# Patient Record
Sex: Female | Born: 1995 | Race: White | Hispanic: No | Marital: Single | Smoking: Current every day smoker
Health system: Southern US, Community
[De-identification: ages and names within clinical notes are randomized; demographics above are authoritative.]

## PROBLEM LIST (undated history)

## (undated) DIAGNOSIS — F909 Attention-deficit hyperactivity disorder, unspecified type: Secondary | ICD-10-CM

## (undated) DIAGNOSIS — G4700 Insomnia, unspecified: Secondary | ICD-10-CM

## (undated) DIAGNOSIS — J029 Acute pharyngitis, unspecified: Secondary | ICD-10-CM

## (undated) DIAGNOSIS — M4120 Other idiopathic scoliosis, site unspecified: Secondary | ICD-10-CM

## (undated) DIAGNOSIS — T50995A Adverse effect of other drugs, medicaments and biological substances, initial encounter: Secondary | ICD-10-CM

## (undated) HISTORY — DX: Insomnia, unspecified: G47.00

## (undated) HISTORY — DX: Other idiopathic scoliosis, site unspecified: M41.20

## (undated) HISTORY — DX: Adverse effect of other drugs, medicaments and biological substances, initial encounter: T50.995A

## (undated) HISTORY — DX: Attention-deficit hyperactivity disorder, unspecified type: F90.9

## (undated) HISTORY — DX: Acute pharyngitis, unspecified: J02.9

---
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
## 2015-01-28 ENCOUNTER — Ambulatory Visit (INDEPENDENT_AMBULATORY_CARE_PROVIDER_SITE_OTHER): Payer: 59 | Admitting: Internal Medicine

## 2015-01-28 ENCOUNTER — Encounter: Payer: Self-pay | Admitting: Internal Medicine

## 2015-01-28 VITALS — BP 106/60 | Temp 98.7°F | Ht 64.0 in | Wt 115.0 lb

## 2015-01-28 DIAGNOSIS — F4322 Adjustment disorder with anxiety: Secondary | ICD-10-CM

## 2015-01-28 DIAGNOSIS — Z0000 Encounter for general adult medical examination without abnormal findings: Secondary | ICD-10-CM

## 2015-01-28 DIAGNOSIS — R634 Abnormal weight loss: Secondary | ICD-10-CM

## 2015-01-28 DIAGNOSIS — M4120 Other idiopathic scoliosis, site unspecified: Secondary | ICD-10-CM

## 2015-01-28 DIAGNOSIS — Z79899 Other long term (current) drug therapy: Secondary | ICD-10-CM

## 2015-01-28 DIAGNOSIS — Z23 Encounter for immunization: Secondary | ICD-10-CM

## 2015-01-28 DIAGNOSIS — F909 Attention-deficit hyperactivity disorder, unspecified type: Secondary | ICD-10-CM | POA: Insufficient documentation

## 2015-01-28 MED ORDER — ESCITALOPRAM OXALATE 10 MG PO TABS
15.0000 mg | ORAL_TABLET | Freq: Every day | ORAL | Status: DC
Start: 2015-01-28 — End: 2015-08-08

## 2015-01-28 MED ORDER — METHYLPHENIDATE 30 MG/9HR TD PTCH: 1.0000 | MEDICATED_PATCH | Freq: Every day | TRANSDERMAL | Status: DC

## 2015-01-28 NOTE — Progress Notes (Signed)
Pre visit review using our clinic review tool, if applicable. No additional management support is needed unless otherwise documented below in the visit note.  Subjective:     History was provided by the patient.  Sandra Ali is a 19 y.o. female who is here for this wellness visit. And med check  Interview alone and with mom    Current Issues: Current concerns include:None Sleep  Not that good   Same   Bed at night mid night 2 am then up at 6 45 am   On 15 mg fpr a months  adhd patch 6-7 and then off   When when done  ith hw 9 1 0.   H (Home) Family Relationships: Physically aggressive with her sister.  Working on relationships.  States that she cannot wait to move out. Communication: Negative communication with her family.  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  Takes care of her dog.  Does have chores but often do not get completed.  E (Education): Grades: Mostly C's and D's could better .  To do test and corrections.   School: [employer].  Good attendence Future Plans: unsure  A (Activities) Sports: no sports Exercise: Does light workouts Activities: Likes art Friends: Has a lot of friends.  A (Auton/Safety) Auto: wears seat belt Bike: does not ride Safety: can swim  D (Diet) Diet: poor diet habits Risky eating habits: Has not been hungry so she does not eat. Intake: Working on taking a multiple vitamin as a supplement Body Image: positive body image  Drugs Tobacco: Used cigarettes for 3 months but is trying to stop.  Has been without for 3 days. Alcohol: No Drugs: No  Sex Activity: abstinent  Suicide Risk Emotions: anxiety Depression: feelings of depression Suicidal: denies suicidal ideation vaping 1 puff every 2-3 hours  Going off tob     Objective:     Filed Vitals:   01/28/15 1448  BP: 106/60  Temp: 98.7 F (37.1 C)  TempSrc: Oral  Height: 5\' 4"  (1.626 m)   Weight: 115 lb (52.164 kg)   Growth parameters are noted and are appropriate for age. Wt Readings from Last 3 Encounters:  01/28/15 115 lb (52.164 kg) (30 %*, Z = -0.52)  11/23/14 117 lb (53.071 kg) (35 %*, Z = -0.37)  10/27/14 120 lb 9.6 oz (54.704 kg) (44 %*, Z = -0.16)   * Growth percentiles are based on CDC 2-20 Years data.    Physical Exam Well-developed well-nourished healthy-appearing appears stated age in no acute distress.  HEENT: Normocephalic  TMs clear  Nl lm  EACs  Eyes RR x2 EOMs appear normal nares patent OP clear teeth in adequate repair. Neck: supple without adenopathy Chest :clear to auscultation breath sounds equal no wheezes rales or rhonchi Breast: normal by inspection . No dimpling, discharge, masses, tenderness or discharge . Cardiovascular :PMI nondisplaced S1-S2 no gallops or murmurs peripheral pulses present without delay Abdomen :soft without organomegaly guarding or rebound Lymph nodes :no significant adenopathy neck axillary inguinal External GU :normal Tanner 5 Extremities: no acute deformities normal range of motion no acute swelling Gait within normal limits Spine t scoliosis right  Neurologic: grossly nonfocal normal tone cranial nerves appear intact. Skin: no acute rashes Screening ortho / MS exam:  Right rib hump  scoliosis ,LOM , joint swelling or gait disturbance . Muscle mass is normal .    Assessment:  Visit for  preventive health examination - Plan: Basic metabolic panel, CBC with Differential/Platelet, Hepatic function panel, Lipid panel, TSH, T4, free  Medication management  Attention deficit hyperactivity disorder (ADHD), unspecified ADHD type  Loss of weight - says she is not hungry  a lot disc weight ok but not to lose  plan labs   Adjustment disorder with anxious mood - poss also depression  SCOLIOSIS, MILD  Need for meningococcal vaccination - Plan: Meningococcal conjugate vaccine 4-valent IM      Plan:   1. Anticipatory  guidance discussed. Nutrition and Physical activity Disc  Weight loss  Labs ordered but couldn't get with venipuncture  Labs  Refill daytrana   Stay on lexapro 15  Agree with counseling  Consider personal counseling also  2. Follow-up visit in 12 months for next wellness visit, or sooner as needed.

## 2015-01-28 NOTE — Patient Instructions (Addendum)
Take lexapro every day may help with sleep and school. Proceed with the add counsleing    Depression and anxiety etc . Consider [facility]  Counselors also .    Health Maintenance - 57-19 Years Old SCHOOL PERFORMANCE After high school, you may attend college or technical or vocational school, enroll in the [employer], or enter the workforce. PHYSICAL, SOCIAL, AND EMOTIONAL DEVELOPMENT  One hour of regular physical activity daily is recommended. Continue to participate in sports.  Develop your own interests and consider community service or volunteerism.  Make decisions about college and work plans.  Throughout these years, you should assume responsibility for your own health care. Increasing independence is important for you.  You may be exploring your sexual identity. Understand that you should never be in a situation that makes you feel uncomfortable, and tell your partner if you do not want to engage in sexual activity.  Body image may become important to you. Be mindful that eating disorders can develop at this time. Talk to your parents or other caregivers if you have concerns about body image, weight gain, or losing weight.  You may notice mood disturbances, depression, anxiety, attention problems, or trouble with alcohol. Talk to your health care provider if you have concerns about mental illness.  Set limits for yourself and talk with your parents or other caregivers about independent decision making.  Handle conflict without physical violence.  Avoid loud noises which may impair hearing.  Limit television and computer time to 2 hours each day. Individuals who engage in excessive inactivity are more likely to become overweight. RECOMMENDED IMMUNIZATIONS  Influenza vaccine.  All adults should be immunized every year.  All adults, including pregnant women and people with hives-only allergy to eggs, can receive the inactivated influenza (IIV) vaccine.  Adults aged 18-49  years can receive the recombinant influenza (RIV) vaccine. The RIV vaccine does not contain any egg protein.  Tetanus, diphtheria, and acellular pertussis (Td, Tdap) vaccine.  Pregnant women should receive 1 dose of Tdap vaccine during each pregnancy. The dose should be obtained regardless of the length of time since the last dose. Immunization is preferred during the 27th to 36th week of gestation.  An adult who has not previously received Tdap or who does not know his or her vaccine status should receive 1 dose of Tdap. This initial dose should be followed by tetanus and diphtheria toxoids (Td) booster doses every 10 years.  Adults with an unknown or incomplete history of completing a 3-dose immunization series with Td-containing vaccines should begin or complete a primary immunization series including a Tdap dose.  Adults should receive a Td booster every 10 years.  Varicella vaccine.  An adult without evidence of immunity to varicella should receive 2 doses or a second dose if he or she has previously received 1 dose.  Pregnant females who do not have evidence of immunity should receive the first dose after pregnancy. This first dose should be obtained before leaving the health care facility. The second dose should be obtained 4-8 weeks after the first dose.  Human papillomavirus (HPV) vaccine.  Females aged 13-26 years who have not received the vaccine previously should obtain the 3-dose series.  The vaccine is not recommended for pregnant females. However, pregnancy testing is not needed before receiving a dose. If a female is found to be pregnant after receiving a dose, no treatment is needed. In that case, the remaining doses should be delayed until after the pregnancy.  Males  aged 13-21 years who have not received the vaccine previously should receive the 3-dose series. Males aged 22-26 years may be immunized.  Immunization is recommended through the age of 71 years for any female  who has sex with males and did not get any or all doses earlier.  Immunization is recommended for any person with an immunocompromised condition through the age of 24 years if he or she did not get any or all doses earlier.  During the 3-dose series, the second dose should be obtained 4-8 weeks after the first dose. The third dose should be obtained 24 weeks after the first dose and 16 weeks after the second dose.  Measles, mumps, and rubella (MMR) vaccine.  Adults born in [birth year] or later should have 1 or more doses of MMR vaccine unless there is a contraindication to the vaccine or there is laboratory evidence of immunity to each of the three diseases.  A routine second dose of MMR vaccine should be obtained at least 28 days after the first dose for students attending postsecondary schools, health care workers, and international travelers.  For females of childbearing age, rubella immunity should be determined. If there is no evidence of immunity, females who are not pregnant should be vaccinated. If there is no evidence of immunity, females who are pregnant should delay immunization until after pregnancy.  Pneumococcal 13-valent conjugate (PCV13) vaccine.  When indicated, a person who is uncertain of his or her immunization history and has no record of immunization should receive the PCV13 vaccine.  An adult aged 42 years or older who has certain medical conditions and has not been previously immunized should receive 1 dose of PCV13 vaccine. This PCV13 should be followed with a dose of pneumococcal polysaccharide (PPSV23) vaccine. The PPSV23 vaccine dose should be obtained at least 8 weeks after the dose of PCV13 vaccine.  An adult aged 62 years or older who has certain medical conditions and previously received 1 or more doses of PPSV23 vaccine should receive 1 dose of PCV13. The PCV13 vaccine dose should be obtained 1 or more years after the last PPSV23 vaccine dose.  Pneumococcal  polysaccharide (PPSV23) vaccine.  When PCV13 is also indicated, PCV13 should be obtained first.  An adult younger than age 65 years who has certain medical conditions should be immunized.  Any person who resides in a long-term care facility should be immunized.  An adult smoker should be immunized.  People with an immunocompromised condition and certain other conditions should receive both PCV13 and PPSV23 vaccines.  People with human immunodeficiency virus (HIV) infection should be immunized as soon as possible after diagnosis.  Immunization during chemotherapy or radiation therapy should be avoided.  Routine use of PPSV23 vaccine is not recommended for American Indians, [place] Natives, or people younger than 65 years unless there are medical conditions that require PPSV23 vaccine.  When indicated, people who have unknown immunization and have no record of immunization should receive PPSV23 vaccine.  One-time revaccination 5 years after the first dose of PPSV23 is recommended for people aged 19-64 years who have chronic kidney failure, nephrotic syndrome, asplenia, or immunocompromised conditions.  Meningococcal vaccine.  Adults with asplenia or persistent complement component deficiencies should receive 2 doses of quadrivalent meningococcal conjugate (MenACWY-D) vaccine. The doses should be obtained at least 2 months apart.  Microbiologists working with certain meningococcal bacteria, [employer] recruits, people at risk during an outbreak, and people who travel to or live in countries with a high rate of meningitis  should be immunized.  A first-year college student up through age 31 years who is living in a residence hall should receive a dose if he or she did not receive a dose on or after his or her 16th birthday.  Adults who have certain high-risk conditions should receive one or more doses of vaccine.  Hepatitis A vaccine.  Adults who wish to be protected from this disease, have  certain high-risk conditions, work with hepatitis A-infected animals, work in hepatitis A research labs, or travel to or work in countries with a high rate of hepatitis A should be immunized.  Adults who were previously unvaccinated and who anticipate close contact with an international adoptee during the first 60 days after arrival in the [place] from a country with a high rate of hepatitis A should be immunized.  Hepatitis B vaccine.  Adults who wish to be protected from this disease, have certain high-risk conditions, may be exposed to blood or other infectious body fluids, are household contacts or sex partners of hepatitis B positive people, are clients or workers in certain care facilities, or travel to or work in countries with a high rate of hepatitis B should be immunized.  Haemophilus influenzae type b (Hib) vaccine.  A previously unvaccinated person with asplenia or sickle cell disease or having a scheduled splenectomy should receive 1 dose of Hib vaccine.  Regardless of previous immunization, a recipient of a hematopoietic stem cell transplant should receive a 3-dose series 6-12 months after his or her successful transplant.  Hib vaccine is not recommended for adults with HIV infection. TESTING  Annual screening for vision and hearing problems is recommended. Vision should be screened at least once between 22-31 years of age.  You may be screened for anemia or tuberculosis.  You should have a blood test to check for high cholesterol.  You should be screened for alcohol and drug use.  If you are sexually active, you may be screened for sexually transmitted infections (STIs), pregnancy, or HIV. You should be screened for STIs if:  Your sexual activity has changed since the last screening test, and you are at an increased risk for chlamydia or gonorrhea. Ask your health care provider if you are at risk.  If you are at an increased risk for hepatitis B, you should be  screened for this virus. You are considered at high risk for hepatitis B if you:  Were born in a country where hepatitis B occurs often. Talk with your health care provider about which countries are considered high risk.  Have parents who were born in a high-risk country and have not received a shot to protect against hepatitis B (hepatitis B vaccine).  Have HIV or AIDS.  Use needles to inject street drugs.  Live with or have sex with someone who has hepatitis B.  Are a man who has sex with other men (MSM).  Get hemodialysis treatment.  Take certain medicines for conditions like cancer, organ transplantation, or autoimmune conditions. NUTRITION   You should:  Have three servings of low-fat milk and dairy products daily. If you do not drink milk or consume dairy products, you should eat calcium-enriched foods, such as juice, bread, or cereal. Dark, leafy greens or canned fish are alternate sources of calcium.  Drink plenty of water. Fruit juice should be limited to 8-12 oz (240-360 mL) each day. Sugary beverages and sodas should be avoided.  Avoid eating foods high in fat, salt, or sugar, such as chips, candy,  and cookies.  Avoid fast foods and limit eating out at restaurants.  Try not to skip meals, especially breakfast. You should eat a variety of vegetables, fruits, and lean meats.  Eat meals together as a family whenever possible. ORAL HEALTH Brush your teeth twice a day and floss at least once a day. You should have two dental exams a year.  SKIN CARE You should wear sunscreen when out in the sun. TALK TO SOMEONE ABOUT:  Precautions against pregnancy, contraception, and sexually transmitted infections.  Taking a prescription medicine daily to prevent HIV infection if you are at risk of being infected with HIV. This is called preexposure prophylaxis (PrEP). You are at risk if you:  Are a female who has sex with other males (MSM).  Are heterosexual and sexually active with  more than one partner.  Take drugs by injection.  Are sexually active with a partner who has HIV.  Whether you are at high risk of being infected with HIV. If you choose to begin PrEP, you should first be tested for HIV. You should then be tested every 3 months for as long as you are taking PrEP.  Drug, tobacco, and alcohol use among your friends or at friends' homes. Smoking tobacco or marijuana and taking drugs have health consequences and may impact your brain development.  Appropriate use of over-the-counter or prescription medicines.  Driving guidelines and riding with friends.  The risks of drinking and driving or boating. Call someone if you have been drinking or using drugs and need a ride. WHAT'S NEXT? Visit your pediatrician or family physician once a year. By young adulthood, you should transition from your pediatrician to a family physician or internal medicine specialist. If you are a female and are sexually active, you may want to begin annual physical exams with a gynecologist. Document Released: 02/21/2007 Document Revised: 12/01/2013 Document Reviewed: 03/13/2007 [facility] Patient Information 2015 [place], [place]. This information is not intended to replace advice given to you by your health care provider. Make sure you discuss any questions you have with your health care provider.

## 2015-02-14 ENCOUNTER — Telehealth: Payer: Self-pay | Admitting: Family Medicine

## 2015-02-14 ENCOUNTER — Other Ambulatory Visit (INDEPENDENT_AMBULATORY_CARE_PROVIDER_SITE_OTHER): Payer: 59

## 2015-02-14 DIAGNOSIS — Z0000 Encounter for general adult medical examination without abnormal findings: Secondary | ICD-10-CM

## 2015-02-14 DIAGNOSIS — R634 Abnormal weight loss: Secondary | ICD-10-CM

## 2015-02-14 LAB — HEPATIC FUNCTION PANEL
ALBUMIN: 4.6 g/dL (ref 3.5–5.2)
ALT: 9 U/L (ref 0–35)
AST: 13 U/L (ref 0–37)
Alkaline Phosphatase: 79 U/L (ref 47–119)
BILIRUBIN TOTAL: 0.5 mg/dL (ref 0.3–1.2)
Bilirubin, Direct: 0.1 mg/dL (ref 0.0–0.3)
Total Protein: 6.9 g/dL (ref 6.0–8.3)

## 2015-02-14 LAB — CBC WITH DIFFERENTIAL/PLATELET
Basophils Absolute: 0 10*3/uL (ref 0.0–0.1)
Basophils Relative: 0.5 % (ref 0.0–3.0)
EOS PCT: 4.3 % (ref 0.0–5.0)
Eosinophils Absolute: 0.3 10*3/uL (ref 0.0–0.7)
HCT: 39.6 % (ref 36.0–49.0)
Hemoglobin: 13.5 g/dL (ref 12.0–16.0)
LYMPHS PCT: 39.4 % (ref 24.0–48.0)
Lymphs Abs: 2.3 10*3/uL (ref 0.7–4.0)
MCHC: 34 g/dL (ref 31.0–37.0)
MCV: 86.5 fl (ref 78.0–98.0)
MONOS PCT: 8.7 % (ref 3.0–12.0)
Monocytes Absolute: 0.5 10*3/uL (ref 0.1–1.0)
NEUTROS PCT: 47.1 % (ref 43.0–71.0)
Neutro Abs: 2.8 10*3/uL (ref 1.4–7.7)
Platelets: 389 10*3/uL (ref 150.0–575.0)
RBC: 4.57 Mil/uL (ref 3.80–5.70)
RDW: 12.9 % (ref 11.4–15.5)
WBC: 5.9 10*3/uL (ref 4.5–13.5)

## 2015-02-14 LAB — LIPID PANEL
CHOLESTEROL: 110 mg/dL (ref 0–200)
HDL: 46.5 mg/dL (ref >39.00)
LDL CALC: 43 mg/dL (ref 0–99)
NONHDL: 63.5
Total CHOL/HDL Ratio: 2
Triglycerides: 104 mg/dL (ref 0.0–149.0)
VLDL: 20.8 mg/dL (ref 0.0–40.0)

## 2015-02-14 LAB — POCT URINALYSIS DIP (MANUAL ENTRY)
Bilirubin, UA: NEGATIVE
GLUCOSE UA: NEGATIVE
Ketones, POC UA: NEGATIVE
Leukocytes, UA: NEGATIVE
NITRITE UA: NEGATIVE
Protein Ur, POC: NEGATIVE
RBC UA: NEGATIVE
Spec Grav, UA: 1.01
UROBILINOGEN UA: 0.2
pH, UA: 7

## 2015-02-14 LAB — BASIC METABOLIC PANEL
BUN: 15 mg/dL (ref 6–23)
CHLORIDE: 104 meq/L (ref 96–112)
CO2: 32 meq/L (ref 19–32)
Calcium: 9 mg/dL (ref 8.4–10.5)
Creatinine, Ser: 0.75 mg/dL (ref 0.40–1.20)
GFR: 106.72 mL/min (ref >60.00)
Glucose, Bld: 96 mg/dL (ref 70–99)
Potassium: 3.9 mEq/L (ref 3.5–5.1)
Sodium: 140 mEq/L (ref 135–145)

## 2015-02-14 LAB — TSH: TSH: 0.91 u[IU]/mL (ref 0.40–5.00)

## 2015-02-14 LAB — T4, FREE: Free T4: 1.04 ng/dL (ref 0.60–1.60)

## 2015-02-14 LAB — C-REACTIVE PROTEIN: CRP: <0.1 mg/dL — ABNORMAL LOW (ref 0.5–20.0)

## 2015-02-14 NOTE — Telephone Encounter (Signed)
Lab orders placed.  

## 2015-02-14 NOTE — Telephone Encounter (Signed)
Need future lab orders and diagnosis code.

## 2015-02-15 LAB — CELIAC PANEL 10
Endomysial Screen: NEGATIVE
GLIADIN IGG: 2 U (ref <20)
Gliadin IgA: 3 Units (ref <20)
IgA: 87 mg/dL (ref 69–380)
Tissue Transglut Ab: 1 U/mL (ref <6)
Tissue Transglutaminase Ab, IgA: 1 U/mL (ref <4)

## 2015-02-15 LAB — HIV ANTIBODY (ROUTINE TESTING W REFLEX): HIV: NONREACTIVE

## 2015-02-22 ENCOUNTER — Encounter: Payer: Self-pay | Admitting: Family Medicine

## 2015-03-02 ENCOUNTER — Other Ambulatory Visit: Payer: Self-pay | Admitting: Family Medicine

## 2015-03-02 MED ORDER — METHYLPHENIDATE 30 MG/9HR TD PTCH: 1.0000 | MEDICATED_PATCH | Freq: Every day | TRANSDERMAL | Status: DC

## 2015-07-19 ENCOUNTER — Telehealth: Payer: Self-pay | Admitting: Internal Medicine

## 2015-07-19 NOTE — Telephone Encounter (Signed)
Yes, sorry!   methylphenidate (DAYTRANA) 30 MG/9HR

## 2015-07-19 NOTE — Telephone Encounter (Signed)
Which medication is needed?  Daytrana?

## 2015-07-19 NOTE — Telephone Encounter (Signed)
Mom made appt , first available for pt to fu on her meds.   Pt is back in school and needed a 4 pm appt. First appt was 8/23 .  Mom would like to know if you could give pt rx to get through to appt?

## 2015-07-20 NOTE — Telephone Encounter (Signed)
Ok to prescribe 30 days?

## 2015-07-21 NOTE — Telephone Encounter (Signed)
Ok for 1 month. Looks like when saw Dr. Fabian Sharp was supposed to continue this and follow up in 1 year.

## 2015-07-22 MED ORDER — METHYLPHENIDATE 30 MG/9HR TD PTCH: 1.0000 | MEDICATED_PATCH | Freq: Every day | TRANSDERMAL | Status: DC

## 2015-07-22 NOTE — Telephone Encounter (Signed)
Pt's mother notified to pick up at the front desk. 

## 2015-08-02 ENCOUNTER — Encounter: Payer: 59 | Admitting: Internal Medicine

## 2015-08-02 NOTE — Progress Notes (Signed)
Document opened and reviewed for OV but appt  canceled same day .  

## 2015-08-08 ENCOUNTER — Encounter: Payer: Self-pay | Admitting: Internal Medicine

## 2015-08-08 ENCOUNTER — Ambulatory Visit (INDEPENDENT_AMBULATORY_CARE_PROVIDER_SITE_OTHER): Payer: 59 | Admitting: Internal Medicine

## 2015-08-08 VITALS — BP 106/60 | Temp 98.7°F | Wt 126.9 lb

## 2015-08-08 DIAGNOSIS — Z23 Encounter for immunization: Secondary | ICD-10-CM | POA: Diagnosis not present

## 2015-08-08 DIAGNOSIS — Z79899 Other long term (current) drug therapy: Secondary | ICD-10-CM | POA: Diagnosis not present

## 2015-08-08 DIAGNOSIS — Z113 Encounter for screening for infections with a predominantly sexual mode of transmission: Secondary | ICD-10-CM

## 2015-08-08 DIAGNOSIS — F17200 Nicotine dependence, unspecified, uncomplicated: Secondary | ICD-10-CM

## 2015-08-08 DIAGNOSIS — Z30013 Encounter for initial prescription of injectable contraceptive: Secondary | ICD-10-CM | POA: Diagnosis not present

## 2015-08-08 DIAGNOSIS — F909 Attention-deficit hyperactivity disorder, unspecified type: Secondary | ICD-10-CM

## 2015-08-08 DIAGNOSIS — Z309 Encounter for contraceptive management, unspecified: Secondary | ICD-10-CM

## 2015-08-08 LAB — POCT URINE PREGNANCY: PREG TEST UR: NEGATIVE

## 2015-08-08 MED ORDER — MEDROXYPROGESTERONE ACETATE 150 MG/ML IM SUSP
150.0000 mg | Freq: Once | INTRAMUSCULAR | Status: AC
  Administered 2015-08-08: 150 mg via INTRAMUSCULAR

## 2015-08-08 MED ORDER — METHYLPHENIDATE 30 MG/9HR TD PTCH: 1.0000 | MEDICATED_PATCH | Freq: Every day | TRANSDERMAL | Status: DC

## 2015-08-08 MED ORDER — ESCITALOPRAM OXALATE 10 MG PO TABS: 15.0000 mg | ORAL_TABLET | Freq: Every day | ORAL | Status: DC

## 2015-08-08 MED ORDER — METHYLPHENIDATE HCL ER (XR) 30 MG PO CP24: 1.0000 | ORAL_CAPSULE | Freq: Every day | ORAL | Status: DC

## 2015-08-08 NOTE — Patient Instructions (Addendum)
Will notify you  of labs when available. Begin every 12 weeks depoprovera .  Can see gyne for  iud    Progesterone implants  Try to take lexapro every eveneing for the reasons stated   To see if helps or not . Off and on can give y ou sdie effects .  agree with continuing counseling to help .  Contraception Choices Contraception (birth control) is the use of any methods or devices to prevent pregnancy. Below are some methods to help avoid pregnancy. HORMONAL METHODS   Contraceptive implant. This is a thin, plastic tube containing progesterone hormone. It does not contain estrogen hormone. Your health care provider inserts the tube in the inner part of the upper arm. The tube can remain in place for up to 3 years. After 3 years, the implant must be removed. The implant prevents the ovaries from releasing an egg (ovulation), thickens the cervical mucus to prevent sperm from entering the uterus, and thins the lining of the inside of the uterus.  Progesterone-only injections. These injections are given every 3 months by your health care provider to prevent pregnancy. This synthetic progesterone hormone stops the ovaries from releasing eggs. It also thickens cervical mucus and changes the uterine lining. This makes it harder for sperm to survive in the uterus.  Birth control pills. These pills contain estrogen and progesterone hormone. They work by preventing the ovaries from releasing eggs (ovulation). They also cause the cervical mucus to thicken, preventing the sperm from entering the uterus. Birth control pills are prescribed by a health care provider.Birth control pills can also be used to treat heavy periods.  Minipill. This type of birth control pill contains only the progesterone hormone. They are taken every day of each month and must be prescribed by your health care provider.  Birth control patch. The patch contains hormones similar to those in birth control pills. It must be changed once  a week and is prescribed by a health care provider.  Vaginal ring. The ring contains hormones similar to those in birth control pills. It is left in the vagina for 3 weeks, removed for 1 week, and then a new one is put back in place. The patient must be comfortable inserting and removing the ring from the vagina.A health care provider's prescription is necessary.  Emergency contraception. Emergency contraceptives prevent pregnancy after unprotected sexual intercourse. This pill can be taken right after sex or up to 5 days after unprotected sex. It is most effective the sooner you take the pills after having sexual intercourse. Most emergency contraceptive pills are available without a prescription. Check with your pharmacist. Do not use emergency contraception as your only form of birth control. BARRIER METHODS   Female condom. This is a thin sheath (latex or rubber) that is worn over the penis during sexual intercourse. It can be used with spermicide to increase effectiveness.  Female condom. This is a soft, loose-fitting sheath that is put into the vagina before sexual intercourse.  Diaphragm. This is a soft, latex, dome-shaped barrier that must be fitted by a health care provider. It is inserted into the vagina, along with a spermicidal jelly. It is inserted before intercourse. The diaphragm should be left in the vagina for 6 to 8 hours after intercourse.  Cervical cap. This is a round, soft, latex or plastic cup that fits over the cervix and must be fitted by a health care provider. The cap can be left in place for up to 48 hours  after intercourse.  Sponge. This is a soft, circular piece of polyurethane foam. The sponge has spermicide in it. It is inserted into the vagina after wetting it and before sexual intercourse.  Spermicides. These are chemicals that kill or block sperm from entering the cervix and uterus. They come in the form of creams, jellies, suppositories, foam, or tablets. They do  not require a prescription. They are inserted into the vagina with an applicator before having sexual intercourse. The process must be repeated every time you have sexual intercourse. INTRAUTERINE CONTRACEPTION  Intrauterine device (IUD). This is a T-shaped device that is put in a woman's uterus during a menstrual period to prevent pregnancy. There are 2 types:  Copper IUD. This type of IUD is wrapped in copper wire and is placed inside the uterus. Copper makes the uterus and fallopian tubes produce a fluid that kills sperm. It can stay in place for 10 years.  Hormone IUD. This type of IUD contains the hormone progestin (synthetic progesterone). The hormone thickens the cervical mucus and prevents sperm from entering the uterus, and it also thins the uterine lining to prevent implantation of a fertilized egg. The hormone can weaken or kill the sperm that get into the uterus. It can stay in place for 3-5 years, depending on which type of IUD is used. PERMANENT METHODS OF CONTRACEPTION  Female tubal ligation. This is when the woman's fallopian tubes are surgically sealed, tied, or blocked to prevent the egg from traveling to the uterus.  Hysteroscopic sterilization. This involves placing a small coil or insert into each fallopian tube. Your doctor uses a technique called hysteroscopy to do the procedure. The device causes scar tissue to form. This results in permanent blockage of the fallopian tubes, so the sperm cannot fertilize the egg. It takes about 3 months after the procedure for the tubes to become blocked. You must use another form of birth control for these 3 months.  Female sterilization. This is when the female has the tubes that carry sperm tied off (vasectomy).This blocks sperm from entering the vagina during sexual intercourse. After the procedure, the man can still ejaculate fluid (semen). NATURAL PLANNING METHODS  Natural family planning. This is not having sexual intercourse or using a  barrier method (condom, diaphragm, cervical cap) on days the woman could become pregnant.  Calendar method. This is keeping track of the length of each menstrual cycle and identifying when you are fertile.  Ovulation method. This is avoiding sexual intercourse during ovulation.  Symptothermal method. This is avoiding sexual intercourse during ovulation, using a thermometer and ovulation symptoms.  Post-ovulation method. This is timing sexual intercourse after you have ovulated. Regardless of which type or method of contraception you choose, it is important that you use condoms to protect against the transmission of sexually transmitted infections (STIs). Talk with your health care provider about which form of contraception is most appropriate for you. Document Released: 11/26/2005 Document Revised: 12/01/2013 Document Reviewed: 05/21/2013 [facility] Patient Information 2015 [place], [place]. This information is not intended to replace advice given to you by your health care provider. Make sure you discuss any questions you have with your health care provider.

## 2015-08-08 NOTE — Progress Notes (Signed)
Pre visit review using our clinic review tool, if applicable. No additional management support is needed unless otherwise documented below in the visit note.  Chief Complaint  Patient presents with  . Follow-up    HPI: Sandra Ali 19 y.o. comes in for medical management a number of issues  Add /adhd   daytrana helps some   But sometimes   There is a shortage  Says helps her focus and get som etaks done. If passes course will grad this year .  just got job at [employer]   To be trained  Sleep  No change  Stress   Depress no suicidal  Emotional  And disc being mean when mom is mean back  No physical violence   Has one partner female  Interested in  Birth control method   Using condoms  No etoh drugs  Tobacco 1ppd  tring to cut down  Has nicotine gum.  Mom says doesn't always take lexapro  She says forgets  Unless reminded  s .  ROS: See pertinent positives and negatives per HPI. No cp sob syncope   Past Medical History  Diagnosis Date  . ADHD 09/29/2007  . SORE THROAT 11/24/2007  . SCOLIOSIS, MILD 03/28/2009  . INSOMNIA UNSPECIFIED 02/24/2008  . UNS ADVRS EFF OTH RX MEDICINAL\T\BIOLOGICAL SBSTNC 04/30/2008    Family History  Problem Relation Age of Onset  . Adopted: Yes    Social History   Social History  . Marital Status: Single    Spouse Name: N/A  . Number of Children: N/A  . Years of Education: N/A   Social History Main Topics  . Smoking status: Never Smoker   . Smokeless tobacco: Never Used  . Alcohol Use: No  . Drug Use: No  . Sexual Activity: Not Asked   Other Topics Concern  . None   Social History Narrative   HH of 3   No tobacco   Adopted from  [place]   [employer] in 8th grade   Now [employer] program   Just got a job at  [employer].    Outpatient Prescriptions Prior to Visit  Medication Sig Dispense Refill  . escitalopram (LEXAPRO) 10 MG tablet Take 1.5 tablets (15 mg total) by mouth daily. 135 tablet 1  . methylphenidate  (DAYTRANA) 30 MG/9HR Place 1 patch onto the skin daily. wear patch for 9 hours only each day 30 patch 0  . methylphenidate (DAYTRANA) 30 MG/9HR Place 1 patch onto the skin daily. wear patch for 9 hours only each day 30 patch 0  . methylphenidate (DAYTRANA) 30 MG/9HR Place 1 patch onto the skin daily. wear patch for 9 hours only each day 30 patch 0   No facility-administered medications prior to visit.     EXAM:  BP 106/60 mmHg  Temp(Src) 98.7 F (37.1 C) (Oral)  Wt 126 lb 14.4 oz (57.561 kg)  Body mass index is 21.77 kg/(m^2).  GENERAL: vitals reviewed and listed above, alert, oriented, appears well hydrated and in no acute distress well groomed HEENT: atraumatic, conjunctiva  clear, no obvious abnormalities on inspection of external nose and ears NECK: no obvious masses on inspection palpation  LUNGS: clear to auscultation bilaterally, no wheezes, rales or rhonchi, good air movement CV: HRRR, no clubbing cyanosis or  peripheral edema nl cap refill  MS: moves all extremities without noticeable focal  abnormality PSYCH: pleasant and cooperative,  No tremor disc with and without mom   ASSESSMENT AND PLAN:  Discussed the following assessment and plan:  Attention deficit hyperactivity disorder (ADHD), unspecified ADHD type - medcation no change  back up capsules in case of shortages   Medication management - stay on lexapro for now   Encounter for contraceptive management, unspecified encounter  Encounter for initial prescription of injectable contraceptive - Plan: medroxyPROGESTERone (DEPO-PROVERA) injection 150 mg  Tobacco dependence - dsic stopping  1ppd currently   Routine screening for STI (sexually transmitted infection) - Plan: GC/chlamydia probe amp, urine, POCT urine pregnancy  Encounter for immunization Unclear if lexapro helping and how often missing doses .  Time constraints of  Multiple issues on one visit  Disc  taking every day and reminders to see if helps at all.  And to avoid se .     Pt interest in contraception... Discussed options  adherence an issue    sti risk had  hiv last time .  Plan fu 3 months   mother concerned about risky relationships and behavior  . Getting counseling [facility] mostly about adhd .disc poss focusing on independence  Emotional  And  Close relationships  .  Fu in 3 months     To address  All of above  .  -Patient advised to return or notify health care team  if symptoms worsen ,persist or new concerns arise.  Patient Instructions   Will notify you  of labs when available. Begin every 12 weeks depoprovera .  Can see gyne for  iud    Progesterone implants  Try to take lexapro every eveneing for the reasons stated   To see if helps or not . Off and on can give y ou sdie effects .  agree with continuing counseling to help .  Contraception Choices Contraception (birth control) is the use of any methods or devices to prevent pregnancy. Below are some methods to help avoid pregnancy. HORMONAL METHODS   Contraceptive implant. This is a thin, plastic tube containing progesterone hormone. It does not contain estrogen hormone. Your health care provider inserts the tube in the inner part of the upper arm. The tube can remain in place for up to 3 years. After 3 years, the implant must be removed. The implant prevents the ovaries from releasing an egg (ovulation), thickens the cervical mucus to prevent sperm from entering the uterus, and thins the lining of the inside of the uterus.  Progesterone-only injections. These injections are given every 3 months by your health care provider to prevent pregnancy. This synthetic progesterone hormone stops the ovaries from releasing eggs. It also thickens cervical mucus and changes the uterine lining. This makes it harder for sperm to survive in the uterus.  Birth control pills. These pills contain estrogen and progesterone hormone. They work by preventing the ovaries from releasing eggs (ovulation). They  also cause the cervical mucus to thicken, preventing the sperm from entering the uterus. Birth control pills are prescribed by a health care provider.Birth control pills can also be used to treat heavy periods.  Minipill. This type of birth control pill contains only the progesterone hormone. They are taken every day of each month and must be prescribed by your health care provider.  Birth control patch. The patch contains hormones similar to those in birth control pills. It must be changed once a week and is prescribed by a health care provider.  Vaginal ring. The ring contains hormones similar to those in birth control pills. It is left in the vagina for 3 weeks, removed for 1  week, and then a new one is put back in place. The patient must be comfortable inserting and removing the ring from the vagina.A health care provider's prescription is necessary.  Emergency contraception. Emergency contraceptives prevent pregnancy after unprotected sexual intercourse. This pill can be taken right after sex or up to 5 days after unprotected sex. It is most effective the sooner you take the pills after having sexual intercourse. Most emergency contraceptive pills are available without a prescription. Check with your pharmacist. Do not use emergency contraception as your only form of birth control. BARRIER METHODS   Female condom. This is a thin sheath (latex or rubber) that is worn over the penis during sexual intercourse. It can be used with spermicide to increase effectiveness.  Female condom. This is a soft, loose-fitting sheath that is put into the vagina before sexual intercourse.  Diaphragm. This is a soft, latex, dome-shaped barrier that must be fitted by a health care provider. It is inserted into the vagina, along with a spermicidal jelly. It is inserted before intercourse. The diaphragm should be left in the vagina for 6 to 8 hours after intercourse.  Cervical cap. This is a round, soft, latex or  plastic cup that fits over the cervix and must be fitted by a health care provider. The cap can be left in place for up to 48 hours after intercourse.  Sponge. This is a soft, circular piece of polyurethane foam. The sponge has spermicide in it. It is inserted into the vagina after wetting it and before sexual intercourse.  Spermicides. These are chemicals that kill or block sperm from entering the cervix and uterus. They come in the form of creams, jellies, suppositories, foam, or tablets. They do not require a prescription. They are inserted into the vagina with an applicator before having sexual intercourse. The process must be repeated every time you have sexual intercourse. INTRAUTERINE CONTRACEPTION  Intrauterine device (IUD). This is a T-shaped device that is put in a woman's uterus during a menstrual period to prevent pregnancy. There are 2 types:  Copper IUD. This type of IUD is wrapped in copper wire and is placed inside the uterus. Copper makes the uterus and fallopian tubes produce a fluid that kills sperm. It can stay in place for 10 years.  Hormone IUD. This type of IUD contains the hormone progestin (synthetic progesterone). The hormone thickens the cervical mucus and prevents sperm from entering the uterus, and it also thins the uterine lining to prevent implantation of a fertilized egg. The hormone can weaken or kill the sperm that get into the uterus. It can stay in place for 3-5 years, depending on which type of IUD is used. PERMANENT METHODS OF CONTRACEPTION  Female tubal ligation. This is when the woman's fallopian tubes are surgically sealed, tied, or blocked to prevent the egg from traveling to the uterus.  Hysteroscopic sterilization. This involves placing a small coil or insert into each fallopian tube. Your doctor uses a technique called hysteroscopy to do the procedure. The device causes scar tissue to form. This results in permanent blockage of the fallopian tubes, so the  sperm cannot fertilize the egg. It takes about 3 months after the procedure for the tubes to become blocked. You must use another form of birth control for these 3 months.  Female sterilization. This is when the female has the tubes that carry sperm tied off (vasectomy).This blocks sperm from entering the vagina during sexual intercourse. After the procedure, the man can still  ejaculate fluid (semen). NATURAL PLANNING METHODS  Natural family planning. This is not having sexual intercourse or using a barrier method (condom, diaphragm, cervical cap) on days the woman could become pregnant.  Calendar method. This is keeping track of the length of each menstrual cycle and identifying when you are fertile.  Ovulation method. This is avoiding sexual intercourse during ovulation.  Symptothermal method. This is avoiding sexual intercourse during ovulation, using a thermometer and ovulation symptoms.  Post-ovulation method. This is timing sexual intercourse after you have ovulated. Regardless of which type or method of contraception you choose, it is important that you use condoms to protect against the transmission of sexually transmitted infections (STIs). Talk with your health care provider about which form of contraception is most appropriate for you. Document Released: 11/26/2005 Document Revised: 12/01/2013 Document Reviewed: 05/21/2013 [facility] Patient Information 2015 [place], [place]. This information is not intended to replace advice given to you by your health care provider. Make sure you discuss any questions you have with your health care provider.      Neta Mends. Minnetta Sandora M.D.

## 2015-08-09 LAB — GC/CHLAMYDIA PROBE AMP, URINE
Chlamydia, Swab/Urine, PCR: NEGATIVE
GC Probe Amp, Urine: NEGATIVE

## 2015-09-02 ENCOUNTER — Encounter: Payer: Self-pay | Admitting: Family Medicine

## 2015-10-26 ENCOUNTER — Ambulatory Visit: Payer: 59 | Admitting: Family Medicine

## 2015-10-27 ENCOUNTER — Ambulatory Visit (INDEPENDENT_AMBULATORY_CARE_PROVIDER_SITE_OTHER): Payer: 59 | Admitting: Family Medicine

## 2015-10-27 DIAGNOSIS — Z30013 Encounter for initial prescription of injectable contraceptive: Secondary | ICD-10-CM

## 2015-10-27 MED ORDER — MEDROXYPROGESTERONE ACETATE 150 MG/ML IM SUSP
150.0000 mg | Freq: Once | INTRAMUSCULAR | Status: AC
  Administered 2015-10-27: 150 mg via INTRAMUSCULAR

## 2015-11-05 ENCOUNTER — Other Ambulatory Visit: Payer: Self-pay | Admitting: Internal Medicine

## 2015-11-08 NOTE — Telephone Encounter (Signed)
Pt did not return for 3 month follow up

## 2015-11-14 NOTE — Telephone Encounter (Signed)
Left a message notifying the pt that she is now due for follow up.  Asked that she call the office to schedule.

## 2015-11-14 NOTE — Telephone Encounter (Signed)
Contact patient have her make ROV then refill x 90 days of med

## 2015-11-16 NOTE — Telephone Encounter (Signed)
Left a message for a return call on home and cell. 

## 2015-11-18 NOTE — Telephone Encounter (Signed)
Left a message for a return call.  Tried reaching the pt by telephone multiple times.  [facility].

## 2015-11-18 NOTE — Telephone Encounter (Signed)
Refill 90 days and send a letter

## 2015-11-21 ENCOUNTER — Encounter: Payer: Self-pay | Admitting: Family Medicine

## 2015-11-21 NOTE — Telephone Encounter (Signed)
Sent to the pharmacy for 90 days.  Letter also sent by mail.

## 2015-11-24 ENCOUNTER — Other Ambulatory Visit: Payer: Self-pay | Admitting: Internal Medicine

## 2015-11-25 ENCOUNTER — Telehealth: Payer: Self-pay | Admitting: Internal Medicine

## 2015-11-25 MED ORDER — ESCITALOPRAM OXALATE 10 MG PO TABS: ORAL_TABLET | ORAL | Status: DC

## 2015-11-25 NOTE — Telephone Encounter (Signed)
Pt request refill of the following: escitalopram (LEXAPRO) 10 MG tablet  Pharmacy does not have the above rx . The rx screen is showing that the transmission attempt to the pharmacy failed     Phamacy: [facility] [address]

## 2015-11-25 NOTE — Telephone Encounter (Signed)
Resent to the pharmacy

## 2016-01-13 ENCOUNTER — Ambulatory Visit (INDEPENDENT_AMBULATORY_CARE_PROVIDER_SITE_OTHER): Payer: 59 | Admitting: Family Medicine

## 2016-01-13 ENCOUNTER — Encounter: Payer: Self-pay | Admitting: Family Medicine

## 2016-01-13 DIAGNOSIS — Z3042 Encounter for surveillance of injectable contraceptive: Secondary | ICD-10-CM | POA: Diagnosis not present

## 2016-01-13 MED ORDER — MEDROXYPROGESTERONE ACETATE 150 MG/ML IM SUSP
150.0000 mg | Freq: Once | INTRAMUSCULAR | Status: AC
Start: 2016-01-13 — End: 2016-01-13
  Administered 2016-01-13: 150 mg via INTRAMUSCULAR

## 2016-03-19 ENCOUNTER — Other Ambulatory Visit: Payer: Self-pay | Admitting: Internal Medicine

## 2016-03-21 ENCOUNTER — Telehealth: Payer: Self-pay | Admitting: Family Medicine

## 2016-03-21 NOTE — Telephone Encounter (Signed)
Pt needs to be seen in the next 30 days.  She is past due for her med check.  Please help the pt to make that appointment.  Thanks!

## 2016-03-21 NOTE — Telephone Encounter (Signed)
Way over due  For   OV    Get her on the with in the next 30 days     Can refill enough to get to appt

## 2016-03-21 NOTE — Telephone Encounter (Signed)
lmom for pt to call back

## 2016-03-21 NOTE — Telephone Encounter (Signed)
Filled for 30 days.  Message sent to scheduling to help the pt make an appt.

## 2016-03-26 NOTE — Telephone Encounter (Signed)
Pt has been sch

## 2016-03-29 ENCOUNTER — Encounter: Payer: Self-pay | Admitting: Internal Medicine

## 2016-03-29 ENCOUNTER — Ambulatory Visit (INDEPENDENT_AMBULATORY_CARE_PROVIDER_SITE_OTHER): Payer: 59 | Admitting: Internal Medicine

## 2016-03-29 VITALS — BP 114/78 | Temp 98.5°F | Wt 144.0 lb

## 2016-03-29 DIAGNOSIS — F4322 Adjustment disorder with anxiety: Secondary | ICD-10-CM

## 2016-03-29 DIAGNOSIS — Z23 Encounter for immunization: Secondary | ICD-10-CM | POA: Diagnosis not present

## 2016-03-29 DIAGNOSIS — Z79899 Other long term (current) drug therapy: Secondary | ICD-10-CM | POA: Diagnosis not present

## 2016-03-29 DIAGNOSIS — F17200 Nicotine dependence, unspecified, uncomplicated: Secondary | ICD-10-CM | POA: Diagnosis not present

## 2016-03-29 MED ORDER — ESCITALOPRAM OXALATE 10 MG PO TABS: ORAL_TABLET | ORAL | Status: DC

## 2016-03-29 NOTE — Patient Instructions (Addendum)
Please try to stop tobacco as discussed  . Let us know hwo we can help  Try not smoking  In social situations   Dont reinfornce positive parts of tobacco .  Get out side some type of exercise such as walking 20 -1 30 minutes per day. Good for the brain health  attention sleep and anxiety prevention not to mention heart   Health for the future .  Walk in th park or elsewhere dont have to go to a gym or do a sport!  TAKE THE LEXAPRO every day   It works better that way....  Come back for depo  6 months wellness visit  And we can do labs at that time as discussed      Tobacco Use Disorder Tobacco use disorder (TUD) is a mental disorder. It is the long-term use of tobacco in spite of related health problems or difficulty with normal life activities. Tobacco is most commonly smoked as cigarettes and less commonly as cigars or pipes. Smokeless chewing tobacco and snuff are also popular. People with TUD get a feeling of extreme pleasure (euphoria) from using tobacco and have a desire to use it again and again. Repeated use of tobacco can cause problems. The addictive effects of tobacco are due mainly tothe ingredient nicotine. Nicotine also causes a rush of adrenaline (epinephrine) in the body. This leads to increased blood pressure, heart rate, and breathing rate. These changes may cause problems for people with high blood pressure, weak hearts, or lung disease. High doses of nicotine in children and pets can lead to seizures and death.  Tobacco contains a number of other unsafe chemicals. These chemicals are especially harmful when inhaled as smoke and can damage almost every organ in the body. Smokers live shorter lives than nonsmokers and are at risk of dying from a number of diseases and cancers. Tobacco smoke can also cause health problems for nonsmokers (due to inhaling secondhand smoke). Smoking is also a fire hazard.  TUD usually starts in the late teenage years and is most common in young  adults between the ages of 50 and 25 years. People who start smoking earlier in life are more likely to continue smoking as adults. TUD is somewhat more common in men than women. People with TUD are at higher risk for using alcohol and other drugs of abuse. RISK FACTORS Risk factors for TUD include:   Having family members with the disorder.  Being around people who use tobacco.  Having an existing mental health issue such as schizophrenia, depression, bipolar disorder, ADHD, or posttraumatic stress disorder (PTSD). SIGNS AND SYMPTOMS  People with tobacco use disorder have two or more of the following signs and symptoms within 12 months:   Use of more tobacco over a longer period than intended.   Not able to cut down or control tobacco use.   A lot of time spent obtaining or using tobacco.   Strong desire or urge to use tobacco (craving). Cravings may last for 6 months or longer after quitting.  Use of tobacco even when use leads to major problems at work, school, or home.   Use of tobacco even when use leads to relationship problems.   Giving up or cutting down on important life activities because of tobacco use.   Repeatedly using tobacco in situations where it puts you or others in physical danger, like smoking in bed.   Use of tobacco even when it is known that a physical or mental problem  is likely related to tobacco use.   Physical problems are numerous and may include chronic bronchitis, emphysema, lung and other cancers, gum disease, high blood pressure, heart disease, and stroke.   Mental problems caused by tobacco may include difficulty sleeping and anxiety.  Need to use greater amounts of tobacco to get the same effect. This means you have developed a tolerance.   Withdrawal symptoms as a result of stopping or rapidly cutting back use. These symptoms may last a month or more after quitting and include the following:   Depressed, anxious, or irritable mood.    Difficulty concentrating.   Increased appetite.  Restlessness or trouble sleeping.   Use of tobacco to avoid withdrawal symptoms. DIAGNOSIS  Tobacco use disorder is diagnosed by your health care provider. A diagnosis may be made by:  Your health care provider asking questions about your tobacco use and any problems it may be causing.  A physical exam.  Lab tests.  You may be referred to a mental health professional or addiction specialist. The severity of tobacco use disorder depends on the number of signs and symptoms you have:   Mild--Two or three symptoms.  Moderate--Four or five symptoms.   Severe--Six or more symptoms.  TREATMENT  Many people with tobacco use disorder are unable to quit on their own and need help. Treatment options include the following:  Nicotine replacement therapy (NRT). NRT provides nicotine without the other harmful chemicals in tobacco. NRT gradually lowers the dosage of nicotine in the body and reduces withdrawal symptoms. NRT is available in over-the-counter forms (gum, lozenges, and skin patches) as well as prescription forms (mouth inhaler and nasal spray).  Medicines.This may include:  Antidepressant medicine that may reduce nicotine cravings.  A medicine that acts on nicotine receptors in the brain to reduce cravings and withdrawal symptoms. It may also block the effects of tobacco in people with TUD who relapse.  Counseling or talk therapy. A form of talk therapy called behavioral therapy is commonly used to treat people with TUD. Behavioral therapy looks at triggers for tobacco use, how to avoid them, and how to cope with cravings. It is most effective in person or by phone but is also available in self-help forms (books and Internet websites).  Support groups. These provide emotional support, advice, and guidance for quitting tobacco. The most effective treatment for TUD is usually a combination of medicine, talk therapy, and  support groups. HOME CARE INSTRUCTIONS  Keep all follow-up visits as directed by your health care provider. This is important.  Take medicines only as directed by your health care provider.  Check with your health care provider before starting new prescription or over-the-counter medicines. SEEK MEDICAL CARE IF:  You are not able to take your medicines as prescribed.  Treatment is not helping your TUD and your symptoms get worse. SEEK IMMEDIATE MEDICAL CARE IF:  You have serious thoughts about hurting yourself or others.  You have trouble breathing, chest pain, sudden weakness, or sudden numbness in part of your body.   This information is not intended to replace advice given to you by your health care provider. Make sure you discuss any questions you have with your health care provider.   Document Released: 08/01/2004 Document Revised: 12/17/2014 Document Reviewed: 01/22/2014 Elsevier Interactive Patient Education [employer]2016 [employer] Inc.

## 2016-03-29 NOTE — Addendum Note (Signed)
Addended by: Raj JanusADKINS, Joscelyn Hardrick T on: 03/29/2016 12:52 PM   Modules accepted: Orders

## 2016-03-29 NOTE — Progress Notes (Signed)
Pre visit review using our clinic review tool, if applicable. No additional management support is needed unless otherwise documented below in the visit note.  Chief Complaint  Patient presents with  . Discuss Pneumococcal Vaccine  . Current Smoker    HPI: Sandra Ali 20 y.o.  Fu med manamgement overdue   periods  Good on depo for contraception also   1 prt condoms  He has reg sti checks  Not intereested in screening today Mood taking lexapro  15 mg "as needed"   Because she forgets to take daily  But the med seems to help.  Soc etoh not much and no current Mj although used in past     Still using tobacco 1/2-1 ppd  Uses to calm down  Tried gum made her dizzy bf smokes gets cough like a smokers cough but no asthma fever . momwanted to look into pneumococcal vaccine Sleep sit  Lat 1 - 7  adhd  Not taking med at this time  ROS: See pertinent positives and negatives per HPI. 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  Looking for more work to get [employer] diploma in may.     Past Medical History  Diagnosis Date  . ADHD 09/29/2007  . SORE THROAT 11/24/2007  . SCOLIOSIS, MILD 03/28/2009  . INSOMNIA UNSPECIFIED 02/24/2008  . UNS ADVRS EFF OTH RX MEDICINAL\T\BIOLOGICAL SBSTNC 04/30/2008    Family History  Problem Relation Age of Onset  . Adopted: Yes    Social History   Social History  . Marital Status: Single    Spouse Name: N/A  . Number of Children: N/A  . Years of Education: N/A   Social History Main Topics  . Smoking status: Current Every Day Smoker  . Smokeless tobacco: Never Used  . Alcohol Use: No  . Drug Use: No  . Sexual Activity: Not Asked   Other Topics Concern  . None   Social History Narrative   HH of 3   No tobacco   Adopted from  [place]   [employer] in 8th grade   Now [employer] program   Just got a job at  [employer].    Outpatient Prescriptions Prior to  Visit  Medication Sig Dispense Refill  . escitalopram (LEXAPRO) 10 MG tablet TAKE 1 AND 1/2 TABLET EVERY DAY 45 tablet 0  . methylphenidate (DAYTRANA) 30 MG/9HR Place 1 patch onto the skin daily. wear patch for 9 hours only each day 30 patch 0  . methylphenidate (DAYTRANA) 30 MG/9HR Place 1 patch onto the skin daily. wear patch for 9 hours only each day 30 patch 0  . methylphenidate (DAYTRANA) 30 MG/9HR Place 1 patch onto the skin daily. wear patch for 9 hours only each day 30 patch 0  . Methylphenidate HCl ER, XR, 30 MG CP24 Take 1 capsule by mouth daily. 30 capsule 0   No facility-administered medications prior to visit.     EXAM:  BP 114/78 mmHg  Temp(Src) 98.5 F (36.9 C) (Oral)  Wt 144 lb (65.318 kg)  Body mass index is 24.71 kg/(m^2).  GENERAL: vitals reviewed and listed above, alert, oriented, appears well hydrated and in no acute distress some motor  Excess but will give eye contact and smile  HEENT: atraumatic, conjunctiva  clear, no obvious abnormalities on inspection of external nose and ears tms clear OP :  no lesion edema or exudate  NECK: no obvious masses on inspection palpation  LUNGS: clear to auscultation bilaterally, no wheezes, rales or rhonchi, good air movement CV: HRRR, no clubbing cyanosis or  peripheral edema nl cap refill  Abdomen:  Sof,t normal bowel sounds without hepatosplenomegaly, no guarding rebound or masses no CVA tenderness MS: moves all extremities without noticeable focal  abnormality PSYCH: pleasant and cooperative, no obvious depression or anxiety Lab Results  Component Value Date   WBC 5.9 02/14/2015   HGB 13.5 02/14/2015   HCT 39.6 02/14/2015   PLT 389.0 02/14/2015   GLUCOSE 96 02/14/2015   CHOL 110 02/14/2015   TRIG 104.0 02/14/2015   HDL 46.50 02/14/2015   LDLCALC 43 02/14/2015   ALT 9 02/14/2015   AST 13 02/14/2015   NA 140 02/14/2015   K 3.9 02/14/2015   CL 104 02/14/2015   CREATININE 0.75 02/14/2015   BUN 15 02/14/2015   CO2  32 02/14/2015   TSH 0.91 02/14/2015    ASSESSMENT AND PLAN:  Discussed the following assessment and plan:  Adjustment disorder with anxious mood  Medication management  Tobacco dependence High risk behavior  Appears to be better now   Tobacco the most   Disc  Cessation and physiology  Exercise help and taking lexapro daily says cant remember even with phone  Reminder  No sx we can do sti screening at next visit cpx with labs  encouraged to sign up for my chart Wt Readings from Last 3 Encounters:  03/29/16 144 lb (65.318 kg) (75 %*, Z = 0.69)  08/08/15 126 lb 14.4 oz (57.561 kg) (53 %*, Z = 0.07)  01/28/15 115 lb (52.164 kg) (30 %*, Z = -0.52)   * Growth percentiles are based on CDC 2-20 Years data.   Will address weight gaibn at next visit  But did discuss healthy ls etc  6 months cpx with labs hiv rpr and urine gc chl screen  -Patient advised to return or notify health care team  if symptoms worsen ,persist or new concerns arise. Total visit > 50% spent counseling and coordinating care as indicated in above note and in instructions to patient .   Patient Instructions  Please try to stop tobacco as discussed  . Let us know hwo we can help  Try not smoking  In social situations   Dont reinfornce positive parts of tobacco .  Get out side some type of exercise such as walking 20 -1 30 minutes per day. Good for the brain health  attention sleep and anxiety prevention not to mention heart   Health for the future .  Walk in th park or elsewhere dont have to go to a gym or do a sport!  TAKE THE LEXAPRO every day   It works better that way....  Come back for depo  6 months wellness visit  And we can do labs at that time as discussed      Tobacco Use Disorder Tobacco use disorder (TUD) is a mental disorder. It is the long-term use of tobacco in spite of related health problems or difficulty with normal life activities. Tobacco is most commonly smoked as cigarettes and less  commonly as cigars or pipes. Smokeless chewing tobacco and snuff are also popular. People with TUD get a feeling of extreme pleasure (euphoria) from using tobacco and have a desire to use it again and again. Repeated use of tobacco can cause problems. The addictive effects of tobacco are due  mainly tothe ingredient nicotine. Nicotine also causes a rush of adrenaline (epinephrine) in the body. This leads to increased blood pressure, heart rate, and breathing rate. These changes may cause problems for people with high blood pressure, weak hearts, or lung disease. High doses of nicotine in children and pets can lead to seizures and death.  Tobacco contains a number of other unsafe chemicals. These chemicals are especially harmful when inhaled as smoke and can damage almost every organ in the body. Smokers live shorter lives than nonsmokers and are at risk of dying from a number of diseases and cancers. Tobacco smoke can also cause health problems for nonsmokers (due to inhaling secondhand smoke). Smoking is also a fire hazard.  TUD usually starts in the late teenage years and is most common in young adults between the ages of 4618 and 25 years. People who start smoking earlier in life are more likely to continue smoking as adults. TUD is somewhat more common in men than women. People with TUD are at higher risk for using alcohol and other drugs of abuse. RISK FACTORS Risk factors for TUD include:   Having family members with the disorder.  Being around people who use tobacco.  Having an existing mental health issue such as schizophrenia, depression, bipolar disorder, ADHD, or posttraumatic stress disorder (PTSD). SIGNS AND SYMPTOMS  People with tobacco use disorder have two or more of the following signs and symptoms within 12 months:   Use of more tobacco over a longer period than intended.   Not able to cut down or control tobacco use.   A lot of time spent obtaining or using tobacco.   Strong  desire or urge to use tobacco (craving). Cravings may last for 6 months or longer after quitting.  Use of tobacco even when use leads to major problems at work, school, or home.   Use of tobacco even when use leads to relationship problems.   Giving up or cutting down on important life activities because of tobacco use.   Repeatedly using tobacco in situations where it puts you or others in physical danger, like smoking in bed.   Use of tobacco even when it is known that a physical or mental problem is likely related to tobacco use.   Physical problems are numerous and may include chronic bronchitis, emphysema, lung and other cancers, gum disease, high blood pressure, heart disease, and stroke.   Mental problems caused by tobacco may include difficulty sleeping and anxiety.  Need to use greater amounts of tobacco to get the same effect. This means you have developed a tolerance.   Withdrawal symptoms as a result of stopping or rapidly cutting back use. These symptoms may last a month or more after quitting and include the following:   Depressed, anxious, or irritable mood.   Difficulty concentrating.   Increased appetite.  Restlessness or trouble sleeping.   Use of tobacco to avoid withdrawal symptoms. DIAGNOSIS  Tobacco use disorder is diagnosed by your health care provider. A diagnosis may be made by:  Your health care provider asking questions about your tobacco use and any problems it may be causing.  A physical exam.  Lab tests.  You may be referred to a mental health professional or addiction specialist. The severity of tobacco use disorder depends on the number of signs and symptoms you have:   Mild--Two or three symptoms.  Moderate--Four or five symptoms.   Severe--Six or more symptoms.  TREATMENT  Many people with tobacco use  disorder are unable to quit on their own and need help. Treatment options include the following:  Nicotine replacement  therapy (NRT). NRT provides nicotine without the other harmful chemicals in tobacco. NRT gradually lowers the dosage of nicotine in the body and reduces withdrawal symptoms. NRT is available in over-the-counter forms (gum, lozenges, and skin patches) as well as prescription forms (mouth inhaler and nasal spray).  Medicines.This may include:  Antidepressant medicine that may reduce nicotine cravings.  A medicine that acts on nicotine receptors in the brain to reduce cravings and withdrawal symptoms. It may also block the effects of tobacco in people with TUD who relapse.  Counseling or talk therapy. A form of talk therapy called behavioral therapy is commonly used to treat people with TUD. Behavioral therapy looks at triggers for tobacco use, how to avoid them, and how to cope with cravings. It is most effective in person or by phone but is also available in self-help forms (books and Internet websites).  Support groups. These provide emotional support, advice, and guidance for quitting tobacco. The most effective treatment for TUD is usually a combination of medicine, talk therapy, and support groups. HOME CARE INSTRUCTIONS  Keep all follow-up visits as directed by your health care provider. This is important.  Take medicines only as directed by your health care provider.  Check with your health care provider before starting new prescription or over-the-counter medicines. SEEK MEDICAL CARE IF:  You are not able to take your medicines as prescribed.  Treatment is not helping your TUD and your symptoms get worse. SEEK IMMEDIATE MEDICAL CARE IF:  You have serious thoughts about hurting yourself or others.  You have trouble breathing, chest pain, sudden weakness, or sudden numbness in part of your body.   This information is not intended to replace advice given to you by your health care provider. Make sure you discuss any questions you have with your health care provider.   Document  Released: 08/01/2004 Document Revised: 12/17/2014 Document Reviewed: 01/22/2014 Elsevier Interactive Patient Education 2016 [employer].      [place]. Leshaun Biebel M.D.

## 2016-04-04 ENCOUNTER — Ambulatory Visit (INDEPENDENT_AMBULATORY_CARE_PROVIDER_SITE_OTHER): Payer: 59 | Admitting: Family Medicine

## 2016-04-04 DIAGNOSIS — Z3042 Encounter for surveillance of injectable contraceptive: Secondary | ICD-10-CM

## 2016-04-04 MED ORDER — MEDROXYPROGESTERONE ACETATE 150 MG/ML IM SUSP
150.0000 mg | Freq: Once | INTRAMUSCULAR | Status: AC
Start: 2016-04-04 — End: 2016-04-04
  Administered 2016-04-04: 150 mg via INTRAMUSCULAR
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
## 2016-05-11 ENCOUNTER — Ambulatory Visit (INDEPENDENT_AMBULATORY_CARE_PROVIDER_SITE_OTHER): Payer: 59 | Admitting: Internal Medicine

## 2016-05-11 ENCOUNTER — Ambulatory Visit (INDEPENDENT_AMBULATORY_CARE_PROVIDER_SITE_OTHER)
Admission: RE | Admit: 2016-05-11 | Discharge: 2016-05-11 | Disposition: A | Discharge status: Home / Self Care | Payer: 59 | Source: Ambulatory Visit | Attending: Internal Medicine | Admitting: Internal Medicine

## 2016-05-11 ENCOUNTER — Encounter: Payer: Self-pay | Admitting: Internal Medicine

## 2016-05-11 VITALS — BP 120/80 | HR 81 | Temp 98.9°F | Ht 64.0 in | Wt 143.2 lb

## 2016-05-11 DIAGNOSIS — Z789 Other specified health status: Secondary | ICD-10-CM

## 2016-05-11 DIAGNOSIS — Z113 Encounter for screening for infections with a predominantly sexual mode of transmission: Secondary | ICD-10-CM

## 2016-05-11 DIAGNOSIS — F1090 Alcohol use, unspecified, uncomplicated: Secondary | ICD-10-CM

## 2016-05-11 DIAGNOSIS — S8001XA Contusion of right knee, initial encounter: Secondary | ICD-10-CM

## 2016-05-11 NOTE — Patient Instructions (Addendum)
X ray  And ice  .     Plan fu  Visit in 2-3 weeks .  Lab today.    Contusion of your knee .   sti screening today . Avoid all mond altering substances  If not improving see sports medicine.  appt  Counseling and psych assessmsent  Advised for med  Visit.  Alcohol Use Disorder Alcohol use disorder is a mental disorder. It is not a one-time incident of heavy drinking. Alcohol use disorder is the excessive and uncontrollable use of alcohol over time that leads to problems with functioning in one or more areas of daily living. People with this disorder risk harming themselves and others when they drink to excess. Alcohol use disorder also can cause other mental disorders, such as mood and anxiety disorders, and serious physical problems. People with alcohol use disorder often misuse other drugs.  Alcohol use disorder is common and widespread. Some people with this disorder drink alcohol to cope with or escape from negative life events. Others drink to relieve chronic pain or symptoms of mental illness. People with a family history of alcohol use disorder are at higher risk of losing control and using alcohol to excess.  Drinking too much alcohol can cause injury, accidents, and health problems. One drink can be too much when you are:  Working.  Pregnant or breastfeeding.  Taking medicines. Ask your doctor.  Driving or planning to drive. SYMPTOMS  Signs and symptoms of alcohol use disorder may include the following:   Consumption ofalcohol inlarger amounts or over a longer period of time than intended.  Multiple unsuccessful attempts to cutdown or control alcohol use.   A great deal of time spent obtaining alcohol, using alcohol, or recovering from the effects of alcohol (hangover).  A strong desire or urge to use alcohol (cravings).   Continued use of alcohol despite problems at work, school, or home because of alcohol use.   Continued use of alcohol despite problems in  relationships because of alcohol use.  Continued use of alcohol in situations when it is physically hazardous, such as driving a car.  Continued use of alcohol despite awareness of a physical or psychological problem that is likely related to alcohol use. Physical problems related to alcohol use can involve the brain, heart, liver, stomach, and intestines. Psychological problems related to alcohol use include intoxication, depression, anxiety, psychosis, delirium, and dementia.   The need for increased amounts of alcohol to achieve the same desired effect, or a decreased effect from the consumption of the same amount of alcohol (tolerance).  Withdrawal symptoms upon reducing or stopping alcohol use, or alcohol use to reduce or avoid withdrawal symptoms. Withdrawal symptoms include:  Racing heart.  Hand tremor.  Difficulty sleeping.  Nausea.  Vomiting.  Hallucinations.  Restlessness.  Seizures. DIAGNOSIS Alcohol use disorder is diagnosed through an assessment by your health care provider. Your health care provider may start by asking three or four questions to screen for excessive or problematic alcohol use. To confirm a diagnosis of alcohol use disorder, at least two symptoms must be present within a 4122-month period. The severity of alcohol use disorder depends on the number of symptoms:  Mild--two or three.  Moderate--four or five.  Severe--six or more. Your health care provider may perform a physical exam or use results from lab tests to see if you have physical problems resulting from alcohol use. Your health care provider may refer you to a mental health professional for evaluation. TREATMENT  Some  people with alcohol use disorder are able to reduce their alcohol use to low-risk levels. Some people with alcohol use disorder need to quit drinking alcohol. When necessary, mental health professionals with specialized training in substance use treatment can help. Your health care  provider can help you decide how severe your alcohol use disorder is and what type of treatment you need. The following forms of treatment are available:   Detoxification. Detoxification involves the use of prescription medicines to prevent alcohol withdrawal symptoms in the first week after quitting. This is important for people with a history of symptoms of withdrawal and for heavy drinkers who are likely to have withdrawal symptoms. Alcohol withdrawal can be dangerous and, in severe cases, cause death. Detoxification is usually provided in a hospital or in-patient substance use treatment facility.  Counseling or talk therapy. Talk therapy is provided by substance use treatment counselors. It addresses the reasons people use alcohol and ways to keep them from drinking again. The goals of talk therapy are to help people with alcohol use disorder find healthy activities and ways to cope with life stress, to identify and avoid triggers for alcohol use, and to handle cravings, which can cause relapse.  Medicines.Different medicines can help treat alcohol use disorder through the following actions:  Decrease alcohol cravings.  Decrease the positive reward response felt from alcohol use.  Produce an uncomfortable physical reaction when alcohol is used (aversion therapy).  Support groups. Support groups are run by people who have quit drinking. They provide emotional support, advice, and guidance. These forms of treatment are often combined. Some people with alcohol use disorder benefit from intensive combination treatment provided by specialized substance use treatment centers. Both inpatient and outpatient treatment programs are available.   This information is not intended to replace advice given to you by your health care provider. Make sure you discuss any questions you have with your health care provider.   Document Released: 01/03/2005 Document Revised: 12/17/2014 Document Reviewed:  03/05/2013 Elsevier Interactive Patient Education [employer]

## 2016-05-12 LAB — RPR

## 2016-05-12 LAB — HIV ANTIBODY (ROUTINE TESTING W REFLEX): HIV 1&2 Ab, 4th Generation: NONREACTIVE

## 2016-05-13 LAB — GC/CHLAMYDIA PROBE AMP
CT PROBE, AMP APTIMA: NOT DETECTED
GC Probe RNA: NOT DETECTED
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
## 2016-06-01 ENCOUNTER — Ambulatory Visit (INDEPENDENT_AMBULATORY_CARE_PROVIDER_SITE_OTHER): Payer: 59 | Admitting: Internal Medicine

## 2016-06-01 ENCOUNTER — Encounter: Payer: Self-pay | Admitting: Internal Medicine

## 2016-06-01 VITALS — BP 110/68 | Temp 99.3°F | Wt 145.1 lb

## 2016-06-01 DIAGNOSIS — Z79899 Other long term (current) drug therapy: Secondary | ICD-10-CM | POA: Diagnosis not present

## 2016-06-01 DIAGNOSIS — T7491XS Unspecified adult maltreatment, confirmed, sequela: Secondary | ICD-10-CM | POA: Diagnosis not present

## 2016-06-01 DIAGNOSIS — S8001XS Contusion of right knee, sequela: Secondary | ICD-10-CM

## 2016-06-01 DIAGNOSIS — F4322 Adjustment disorder with anxiety: Secondary | ICD-10-CM | POA: Diagnosis not present

## 2016-06-01 NOTE — Patient Instructions (Signed)
Let us know if knee bothering you with walking  Or  Exercise  July 12 - 26 th     Appointment     with depo

## 2016-09-26 NOTE — Progress Notes (Signed)
Pre visit review using our clinic review tool, if applicable. No additional management support is needed unless otherwise documented below in the visit note.  Chief Complaint  Patient presents with  . Follow-up    HPI: Sandra Ali 20 y.o.    Comes in for follow-up of medication. She is taking 15 mg Lexapro per day seems to be helping some but  Ran out  This week Monday .  3-4 days a go  Doesn't feel that well less sleep last night Once a week  For now counseling .  Feels her counselor is helpful . At [facility]    Moved out of moms  Place.  Friends   lydia .    No cough or fever  .  Smoking  Less pack per day . ocass ets exposures mj she doesn use Worries about  Mom.  Interfering and doing things controlling .  No to only ocass etoh  No rd no relationship at this time and  Stopped the depoprovera.  Working [employer] and to add on  [employer]   Has gone back on the Daytrana patch for the last month daily puts it on about 10:00 often 5 feels that it helps her get to work on time and get her stuff done. C moms comments from my chart. No se   From: MARGERY SZOSTAK  Sent: 09/24/2016  1:16 PM  To: [facility] Clinical Pool  Subject: Non-Urgent Medical Question             Hello Dr. Fabian Sharp,  [name] is coming in on Thurs to request a refill for her ADHD patches (daytrana). Wearing this has made a huge difference on her anger even though her risky behavior is still a HUGE issue. She continues to disappear and "live with friends" on a regular basis whenever I try to talk to her about "adult decisions" and continues to steal (she will deny stealing). I respectfully request that you refill the patches - at least it is a step.  She is no longer receiving her Depo shots - she says that she "isn't doing that anymore even though I have lots of offers".  She will not discuss the life long effects of an unwanted pregnancy. Please discuss this with her.  Life at home remains very  volatile and it centers around her. She is [facility] counselor [facility] on a weekly basis and this appears to be working. She still refuses to get a 2nd job.  I will be out of town Wed-Fri. but can be reached by phone [phone]). thank you!    ROS: See pertinent positives   and negatives per HPI.  Past Medical History:  Diagnosis Date  . ADHD 09/29/2007  . INSOMNIA UNSPECIFIED 02/24/2008  . SCOLIOSIS, MILD 03/28/2009  . SORE THROAT 11/24/2007  . UNS ADVRS EFF OTH RX MEDICINAL\T\BIOLOGICAL SBSTNC 04/30/2008    Family History  Problem Relation Age of Onset  . Adopted: Yes    Social History   Social History  . Marital status: Single    Spouse name: N/A  . Number of children: N/A  . Years of education: N/A   Social History Main Topics  . Smoking status: Current Every Day Smoker  . Smokeless tobacco: Never Used  . Alcohol use No  . Drug use: No  . Sexual activity: Not Asked   Other Topics Concern  . None   Social History Narrative   HH  of 3   No tobacco   Adopted from  [place]   [employer] in 8th grade   Now [employer] program   Just got a job at  [employer].    Outpatient Medications Prior to Visit  Medication Sig Dispense Refill  . escitalopram (LEXAPRO) 10 MG tablet TAKE 1 AND 1/2 TABLET EVERY DAY 45 tablet 6  . medroxyPROGESTERone (DEPO-PROVERA) 150 MG/ML injection Inject 150 mg into the muscle every 3 (three) months.     No facility-administered medications prior to visit.      EXAM:  BP 114/68 (BP Location: Right Arm, Patient Position: Sitting, Cuff Size: Normal)   Temp 98.4 F (36.9 C) (Oral)   Wt 147 lb 3.2 oz (66.8 kg)   BMI 25.27 kg/m   Body mass index is 25.27 kg/m.  GENERAL: vitals reviewed and listed above, alert, oriented, appears well hydrated and in no acute distress ocass cough  HEENT: atraumatic, conjunctiva  clear, no obvious abnormalities on inspection of external nose and ears OP : no lesion edema  or exudate  NECK: no obvious masses on inspection palpation  LUNGS: clear to auscultation bilaterally, no wheezes, rales or rhonchi, good air movement Abdomen:  Sof,t normal bowel sounds without hepatosplenomegaly, no guarding rebound or masses no CVA tenderness CV: HRRR, no clubbing cyanosis or  peripheral edema nl cap refill  MS: moves all extremities without noticeable focal  abnormality PSYCH: pleasant and cooperative, no obvious depression or anxiety  ASSESSMENT AND PLAN:  Discussed the following assessment and plan:  Attention deficit hyperactivity disorder (ADHD), unspecified ADHD type - contract  refill disc  rov in 3 months  Adjustment disorder with anxious mood  Medication management  Tobacco dependence  Encounter for immunization - Plan: Flu Vaccine QUAD 36+ mos IM Hx of High risk behavior  Seems to be less so per pat but not so per mom    in counseling  Doing some better and  Patch some help  Will refill Benefit more than risk of medications  to continue. Encouraged her to talk with counselor as how to  Handle  Relationship with mom .m even consider mom  Disc concerns with counselor .  tox screen   And contract  Today . ROV in 3 months  Also disc    Expectant management. If needed for  future contraception pt aware.  Says she would not make a sudden decision   And warned about  All of [place] having  porr decision at at any time.  rov in 3 mos and will readdress .  -Patient advised to return or notify health care team  if symptoms worsen ,persist or new concerns arise.  Patient Instructions   Glad you are doing some better . Refill patch today . Work on getting sleep and stay  with counselor  Visits.  Send in lexapro to your pharmacy and restart this  right away    To feel better .  Stopping tobacco will help your cough .  Urine screen today . Let us know if you want to go back on the depoprovera.  Limit  alcohol as you are doing ....  Plan ROV in 3 months  Or as needed  to do med check .  Wt Readings from Last 3 Encounters:  09/27/16 147 lb 3.2 oz (66.8 kg) (77 %, Z= 0.75)*  06/01/16 145 lb 1.6 oz (65.8 kg) (76 %, Z= 0.71)*  05/11/16 143 lb 3.2 oz (65 kg) (74 %, Z=  0.65)*   * Growth percentiles are based on CDC 2-20 Years data.      Neta MendsWanda K. Panosh M.D.

## 2016-09-27 ENCOUNTER — Encounter: Payer: Self-pay | Admitting: Emergency Medicine

## 2016-09-27 ENCOUNTER — Ambulatory Visit (INDEPENDENT_AMBULATORY_CARE_PROVIDER_SITE_OTHER): Payer: 59 | Admitting: Internal Medicine

## 2016-09-27 ENCOUNTER — Encounter: Payer: Self-pay | Admitting: Internal Medicine

## 2016-09-27 VITALS — BP 114/68 | Temp 98.4°F | Wt 147.2 lb

## 2016-09-27 DIAGNOSIS — Z23 Encounter for immunization: Secondary | ICD-10-CM

## 2016-09-27 DIAGNOSIS — F909 Attention-deficit hyperactivity disorder, unspecified type: Secondary | ICD-10-CM

## 2016-09-27 DIAGNOSIS — F4322 Adjustment disorder with anxiety: Secondary | ICD-10-CM | POA: Diagnosis not present

## 2016-09-27 DIAGNOSIS — Z79899 Other long term (current) drug therapy: Secondary | ICD-10-CM | POA: Diagnosis not present

## 2016-09-27 DIAGNOSIS — F17200 Nicotine dependence, unspecified, uncomplicated: Secondary | ICD-10-CM

## 2016-09-27 MED ORDER — METHYLPHENIDATE 15 MG/9HR TD PTCH: 15.0000 mg | MEDICATED_PATCH | Freq: Every day | TRANSDERMAL | 0 refills | Status: DC

## 2016-09-27 MED ORDER — ESCITALOPRAM OXALATE 10 MG PO TABS: ORAL_TABLET | ORAL | 6 refills | Status: DC

## 2016-09-27 NOTE — Patient Instructions (Addendum)
Glad you are doing some better . Refill patch today . Work on getting sleep and stay  with counselor  Visits.  Send in lexapro to your pharmacy and restart this  right away    To feel better .  Stopping tobacco will help your cough .  Urine screen today . Let us know if you want to go back on the depoprovera.  Limit  alcohol as you are doing ....  Plan ROV in 3 months  Or as needed to do med check .  Wt Readings from Last 3 Encounters:  09/27/16 147 lb 3.2 oz (66.8 kg) (77 %, Z= 0.75)*  06/01/16 145 lb 1.6 oz (65.8 kg) (76 %, Z= 0.71)*  05/11/16 143 lb 3.2 oz (65 kg) (74 %, Z= 0.65)*   * Growth percentiles are based on CDC 2-20 Years data.

## 2016-10-05 ENCOUNTER — Telehealth: Payer: Self-pay

## 2016-10-05 NOTE — Telephone Encounter (Signed)
Received PA request for Daytrana patches from [facility]. PA submitted & is pending. Key: [id number]

## 2016-10-05 NOTE — Telephone Encounter (Signed)
PA approved, form faxed back to pharmacy. 

## 2016-10-31 ENCOUNTER — Emergency Department (HOSPITAL_COMMUNITY): Payer: 59

## 2016-10-31 ENCOUNTER — Inpatient Hospital Stay (HOSPITAL_COMMUNITY): Payer: 59

## 2016-10-31 ENCOUNTER — Encounter (HOSPITAL_COMMUNITY): Payer: Self-pay

## 2016-10-31 ENCOUNTER — Inpatient Hospital Stay (HOSPITAL_COMMUNITY)
Admission: EM | Admit: 2016-10-31 | Discharge: 2016-11-02 | DRG: 896 | Disposition: A | Discharge status: Home / Self Care | Payer: 59 | Attending: Internal Medicine | Admitting: Internal Medicine

## 2016-10-31 DIAGNOSIS — F909 Attention-deficit hyperactivity disorder, unspecified type: Secondary | ICD-10-CM | POA: Diagnosis present

## 2016-10-31 DIAGNOSIS — E860 Dehydration: Secondary | ICD-10-CM | POA: Diagnosis present

## 2016-10-31 DIAGNOSIS — I959 Hypotension, unspecified: Secondary | ICD-10-CM | POA: Diagnosis present

## 2016-10-31 DIAGNOSIS — F10929 Alcohol use, unspecified with intoxication, unspecified: Secondary | ICD-10-CM

## 2016-10-31 DIAGNOSIS — F10129 Alcohol abuse with intoxication, unspecified: Secondary | ICD-10-CM | POA: Diagnosis present

## 2016-10-31 DIAGNOSIS — R400 Somnolence: Secondary | ICD-10-CM | POA: Diagnosis not present

## 2016-10-31 DIAGNOSIS — R680 Hypothermia, not associated with low environmental temperature: Secondary | ICD-10-CM | POA: Diagnosis present

## 2016-10-31 DIAGNOSIS — Y908 Blood alcohol level of 240 mg/100 ml or more: Secondary | ICD-10-CM | POA: Diagnosis present

## 2016-10-31 DIAGNOSIS — F913 Oppositional defiant disorder: Secondary | ICD-10-CM | POA: Diagnosis present

## 2016-10-31 DIAGNOSIS — J9602 Acute respiratory failure with hypercapnia: Secondary | ICD-10-CM | POA: Diagnosis present

## 2016-10-31 DIAGNOSIS — R4182 Altered mental status, unspecified: Secondary | ICD-10-CM

## 2016-10-31 DIAGNOSIS — J9690 Respiratory failure, unspecified, unspecified whether with hypoxia or hypercapnia: Secondary | ICD-10-CM | POA: Diagnosis present

## 2016-10-31 DIAGNOSIS — F10920 Alcohol use, unspecified with intoxication, uncomplicated: Secondary | ICD-10-CM | POA: Diagnosis not present

## 2016-10-31 DIAGNOSIS — Z79899 Other long term (current) drug therapy: Secondary | ICD-10-CM | POA: Diagnosis not present

## 2016-10-31 DIAGNOSIS — R198 Other specified symptoms and signs involving the digestive system and abdomen: Secondary | ICD-10-CM | POA: Diagnosis not present

## 2016-10-31 DIAGNOSIS — R739 Hyperglycemia, unspecified: Secondary | ICD-10-CM | POA: Diagnosis present

## 2016-10-31 DIAGNOSIS — E876 Hypokalemia: Secondary | ICD-10-CM | POA: Diagnosis present

## 2016-10-31 DIAGNOSIS — J9601 Acute respiratory failure with hypoxia: Secondary | ICD-10-CM | POA: Diagnosis present

## 2016-10-31 DIAGNOSIS — F329 Major depressive disorder, single episode, unspecified: Secondary | ICD-10-CM | POA: Diagnosis present

## 2016-10-31 DIAGNOSIS — Z9114 Patient's other noncompliance with medication regimen: Secondary | ICD-10-CM

## 2016-10-31 DIAGNOSIS — R339 Retention of urine, unspecified: Secondary | ICD-10-CM | POA: Diagnosis present

## 2016-10-31 DIAGNOSIS — G92 Toxic encephalopathy: Secondary | ICD-10-CM | POA: Diagnosis present

## 2016-10-31 DIAGNOSIS — T17908A Unspecified foreign body in respiratory tract, part unspecified causing other injury, initial encounter: Secondary | ICD-10-CM

## 2016-10-31 DIAGNOSIS — J9600 Acute respiratory failure, unspecified whether with hypoxia or hypercapnia: Secondary | ICD-10-CM

## 2016-10-31 DIAGNOSIS — R402443 Other coma, without documented Glasgow coma scale score, or with partial score reported, at hospital admission: Secondary | ICD-10-CM | POA: Diagnosis present

## 2016-10-31 DIAGNOSIS — F902 Attention-deficit hyperactivity disorder, combined type: Secondary | ICD-10-CM | POA: Diagnosis not present

## 2016-10-31 DIAGNOSIS — F17200 Nicotine dependence, unspecified, uncomplicated: Secondary | ICD-10-CM | POA: Diagnosis present

## 2016-10-31 DIAGNOSIS — F912 Conduct disorder, adolescent-onset type: Secondary | ICD-10-CM | POA: Diagnosis not present

## 2016-10-31 LAB — COMPREHENSIVE METABOLIC PANEL
ALBUMIN: 4.4 g/dL (ref 3.5–5.0)
ALK PHOS: 104 U/L (ref 38–126)
ALT: 18 U/L (ref 14–54)
ANION GAP: 11 (ref 5–15)
AST: 24 U/L (ref 15–41)
BUN: 11 mg/dL (ref 6–20)
CALCIUM: 9.2 mg/dL (ref 8.9–10.3)
CO2: 20 mmol/L — AB (ref 22–32)
Chloride: 106 mmol/L (ref 101–111)
Creatinine, Ser: 0.76 mg/dL (ref 0.44–1.00)
GFR calc Af Amer: >60 mL/min (ref >60)
GFR calc non Af Amer: >60 mL/min (ref >60)
GLUCOSE: 196 mg/dL — AB (ref 65–99)
Potassium: 3.1 mmol/L — ABNORMAL LOW (ref 3.5–5.1)
SODIUM: 137 mmol/L (ref 135–145)
Total Bilirubin: 0.6 mg/dL (ref 0.3–1.2)
Total Protein: 6.7 g/dL (ref 6.5–8.1)

## 2016-10-31 LAB — CBC WITH DIFFERENTIAL/PLATELET
BASOS ABS: 0.2 10*3/uL — AB (ref 0.0–0.1)
Basophils Relative: 1 %
EOS ABS: 0.5 10*3/uL (ref 0.0–0.7)
Eosinophils Relative: 3 %
HEMATOCRIT: 46.2 % — AB (ref 36.0–46.0)
HEMOGLOBIN: 16.1 g/dL — AB (ref 12.0–15.0)
LYMPHS PCT: 35 %
Lymphs Abs: 5.8 10*3/uL — ABNORMAL HIGH (ref 0.7–4.0)
MCH: 30.3 pg (ref 26.0–34.0)
MCHC: 34.8 g/dL (ref 30.0–36.0)
MCV: 87 fL (ref 78.0–100.0)
Monocytes Absolute: 1.2 10*3/uL — ABNORMAL HIGH (ref 0.1–1.0)
Monocytes Relative: 7 %
NEUTROS PCT: 54 %
Neutro Abs: 8.9 10*3/uL — ABNORMAL HIGH (ref 1.7–7.7)
Platelets: 420 10*3/uL — ABNORMAL HIGH (ref 150–400)
RBC: 5.31 MIL/uL — AB (ref 3.87–5.11)
RDW: 12.1 % (ref 11.5–15.5)
WBC: 16.6 10*3/uL — AB (ref 4.0–10.5)

## 2016-10-31 LAB — RAPID URINE DRUG SCREEN, HOSP PERFORMED
AMPHETAMINES: NOT DETECTED
BARBITURATES: NOT DETECTED
BENZODIAZEPINES: NOT DETECTED
Cocaine: NOT DETECTED
Opiates: NOT DETECTED
TETRAHYDROCANNABINOL: NOT DETECTED

## 2016-10-31 LAB — URINALYSIS, ROUTINE W REFLEX MICROSCOPIC
BILIRUBIN URINE: NEGATIVE
Glucose, UA: NEGATIVE mg/dL
HGB URINE DIPSTICK: NEGATIVE
KETONES UR: NEGATIVE mg/dL
Leukocytes, UA: NEGATIVE
NITRITE: NEGATIVE
PH: 5.5 (ref 5.0–8.0)
Protein, ur: NEGATIVE mg/dL
Specific Gravity, Urine: 1.008 (ref 1.005–1.030)

## 2016-10-31 LAB — I-STAT BETA HCG BLOOD, ED (MC, WL, AP ONLY): I-stat hCG, quantitative: <5 m[IU]/mL (ref <5)

## 2016-10-31 LAB — I-STAT ARTERIAL BLOOD GAS, ED
ACID-BASE DEFICIT: 10 mmol/L — AB (ref 0.0–2.0)
Bicarbonate: 16.1 mmol/L — ABNORMAL LOW (ref 20.0–28.0)
O2 Saturation: 100 %
PCO2 ART: 34.6 mmHg (ref 32.0–48.0)
TCO2: 17 mmol/L (ref 0–100)
pH, Arterial: 7.277 — ABNORMAL LOW (ref 7.350–7.450)
pO2, Arterial: 336 mmHg — ABNORMAL HIGH (ref 83.0–108.0)

## 2016-10-31 LAB — I-STAT CHEM 8, ED
BUN: 14 mg/dL (ref 6–20)
CREATININE: 1.1 mg/dL — AB (ref 0.44–1.00)
Calcium, Ion: 1.11 mmol/L — ABNORMAL LOW (ref 1.15–1.40)
Chloride: 105 mmol/L (ref 101–111)
Glucose, Bld: 197 mg/dL — ABNORMAL HIGH (ref 65–99)
HEMATOCRIT: 48 % — AB (ref 36.0–46.0)
HEMOGLOBIN: 16.3 g/dL — AB (ref 12.0–15.0)
POTASSIUM: 3.2 mmol/L — AB (ref 3.5–5.1)
Sodium: 140 mmol/L (ref 135–145)
TCO2: 20 mmol/L (ref 0–100)

## 2016-10-31 LAB — I-STAT CG4 LACTIC ACID, ED
Lactic Acid, Venous: 4.44 mmol/L (ref 0.5–1.9)
Lactic Acid, Venous: 2.08 mmol/L (ref 0.5–1.9)

## 2016-10-31 LAB — LACTIC ACID, PLASMA: Lactic Acid, Venous: 1.3 mmol/L (ref 0.5–1.9)

## 2016-10-31 LAB — ACETAMINOPHEN LEVEL

## 2016-10-31 LAB — MAGNESIUM: MAGNESIUM: 1.9 mg/dL (ref 1.7–2.4)

## 2016-10-31 LAB — SALICYLATE LEVEL: Salicylate Lvl: <7 mg/dL (ref 2.8–30.0)

## 2016-10-31 LAB — CBG MONITORING, ED: Glucose-Capillary: 153 mg/dL — ABNORMAL HIGH (ref 65–99)

## 2016-10-31 LAB — OSMOLALITY: Osmolality: 423 mOsm/kg (ref 275–295)

## 2016-10-31 LAB — PHOSPHORUS: PHOSPHORUS: 3.7 mg/dL (ref 2.5–4.6)

## 2016-10-31 LAB — CK: Total CK: 103 U/L (ref 38–234)

## 2016-10-31 LAB — ETHANOL: ALCOHOL ETHYL (B): 499 mg/dL — AB (ref <5)

## 2016-10-31 MED ORDER — ONDANSETRON HCL 4 MG/2ML IJ SOLN: 4.0000 mg | Freq: Four times a day (QID) | INTRAMUSCULAR | Status: DC | PRN

## 2016-10-31 MED ORDER — MIDAZOLAM HCL 2 MG/2ML IJ SOLN
2.0000 mg | INTRAMUSCULAR | Status: DC | PRN
  Administered 2016-10-31: 2 mg via INTRAVENOUS

## 2016-10-31 MED ORDER — SODIUM CHLORIDE 0.9 % IV BOLUS (SEPSIS)
2000.0000 mL | Freq: Once | INTRAVENOUS | Status: AC
  Administered 2016-10-31: 2000 mL via INTRAVENOUS

## 2016-10-31 MED ORDER — FENTANYL CITRATE (PF) 100 MCG/2ML IJ SOLN: 50.0000 ug | Freq: Once | INTRAMUSCULAR | Status: DC

## 2016-10-31 MED ORDER — SODIUM CHLORIDE 0.9 % IV SOLN
INTRAVENOUS | Status: DC | PRN
  Administered 2016-10-31: 1000 mL via INTRAVENOUS

## 2016-10-31 MED ORDER — POTASSIUM CHLORIDE CRYS ER 20 MEQ PO TBCR
40.0000 meq | EXTENDED_RELEASE_TABLET | Freq: Once | ORAL | Status: AC
  Administered 2016-10-31: 40 meq via ORAL
  Filled 2016-10-31: qty 2

## 2016-10-31 MED ORDER — ZIPRASIDONE MESYLATE 20 MG IM SOLR
10.0000 mg | Freq: Once | INTRAMUSCULAR | Status: AC
  Administered 2016-10-31: 10 mg via INTRAMUSCULAR
  Filled 2016-10-31: qty 20

## 2016-10-31 MED ORDER — PIPERACILLIN-TAZOBACTAM 3.375 G IVPB 30 MIN
3.3750 g | Freq: Once | INTRAVENOUS | Status: AC
  Administered 2016-10-31: 3.375 g via INTRAVENOUS
  Filled 2016-10-31: qty 50

## 2016-10-31 MED ORDER — HEPARIN SODIUM (PORCINE) 5000 UNIT/ML IJ SOLN
5000.0000 [IU] | Freq: Three times a day (TID) | INTRAMUSCULAR | Status: DC
  Administered 2016-10-31 – 2016-11-02 (×5): 5000 [IU] via SUBCUTANEOUS
  Filled 2016-10-31 (×5): qty 1

## 2016-10-31 MED ORDER — ETOMIDATE 2 MG/ML IV SOLN
INTRAVENOUS | Status: DC | PRN
  Administered 2016-10-31: 20 mg via INTRAVENOUS

## 2016-10-31 MED ORDER — FENTANYL CITRATE (PF) 100 MCG/2ML IJ SOLN
100.0000 ug | INTRAMUSCULAR | Status: DC | PRN
Start: 2016-10-31 — End: 2016-11-01
  Administered 2016-10-31: 100 ug via INTRAVENOUS

## 2016-10-31 MED ORDER — ONDANSETRON HCL 4 MG/2ML IJ SOLN
4.0000 mg | Freq: Once | INTRAMUSCULAR | Status: AC
  Administered 2016-10-31: 4 mg via INTRAVENOUS
  Filled 2016-10-31: qty 2

## 2016-10-31 MED ORDER — FENTANYL 2500MCG IN NS 250ML (10MCG/ML) PREMIX INFUSION: 25.0000 ug/h | INTRAVENOUS | Status: DC

## 2016-10-31 MED ORDER — MAGNESIUM SULFATE 2 GM/50ML IV SOLN
2.0000 g | Freq: Once | INTRAVENOUS | Status: AC
  Administered 2016-10-31: 2 g via INTRAVENOUS
  Filled 2016-10-31: qty 50

## 2016-10-31 MED ORDER — SODIUM CHLORIDE 0.9 % IV SOLN
INTRAVENOUS | Status: AC
  Administered 2016-10-31: 05:00:00 via INTRAVENOUS

## 2016-10-31 MED ORDER — HALOPERIDOL LACTATE 5 MG/ML IJ SOLN
5.0000 mg | Freq: Once | INTRAMUSCULAR | Status: AC
  Administered 2016-10-31: 5 mg via INTRAVENOUS
  Filled 2016-10-31: qty 1

## 2016-10-31 MED ORDER — DIPHENHYDRAMINE HCL 50 MG/ML IJ SOLN
50.0000 mg | Freq: Once | INTRAMUSCULAR | Status: AC
  Administered 2016-10-31: 50 mg via INTRAVENOUS
  Filled 2016-10-31: qty 1

## 2016-10-31 MED ORDER — VANCOMYCIN HCL IN DEXTROSE 1-5 GM/200ML-% IV SOLN
1000.0000 mg | Freq: Once | INTRAVENOUS | Status: AC
Start: 2016-10-31 — End: 2016-10-31
  Administered 2016-10-31: 1000 mg via INTRAVENOUS
  Filled 2016-10-31: qty 200

## 2016-10-31 MED ORDER — SUCCINYLCHOLINE CHLORIDE 20 MG/ML IJ SOLN
INTRAMUSCULAR | Status: DC | PRN
  Administered 2016-10-31: 100 mg via INTRAVENOUS

## 2016-10-31 MED ORDER — SODIUM CHLORIDE 0.9 % IV SOLN
INTRAVENOUS | Status: DC
  Administered 2016-10-31: 07:00:00 via INTRAVENOUS

## 2016-10-31 MED ORDER — NALOXONE HCL 0.4 MG/ML IJ SOLN
0.4000 mg | Freq: Once | INTRAMUSCULAR | Status: AC
Start: 2016-10-31 — End: 2016-10-31
  Administered 2016-10-31: 0.4 mg via INTRAVENOUS
  Filled 2016-10-31: qty 1

## 2016-10-31 MED ORDER — MIDAZOLAM HCL 2 MG/2ML IJ SOLN
2.0000 mg | INTRAMUSCULAR | Status: DC | PRN
  Administered 2016-10-31: 2 mg via INTRAVENOUS
  Filled 2016-10-31 (×3): qty 2

## 2016-10-31 MED ORDER — SODIUM CHLORIDE 0.9 % IV SOLN: 250.0000 mL | INTRAVENOUS | Status: DC | PRN

## 2016-10-31 MED ORDER — FENTANYL CITRATE (PF) 100 MCG/2ML IJ SOLN
100.0000 ug | INTRAMUSCULAR | Status: DC | PRN
  Administered 2016-10-31: 100 ug via INTRAVENOUS
  Filled 2016-10-31 (×2): qty 2

## 2016-10-31 MED ORDER — FENTANYL BOLUS VIA INFUSION
50.0000 ug | INTRAVENOUS | Status: DC | PRN
  Filled 2016-10-31: qty 50

## 2016-10-31 MED ORDER — SODIUM CHLORIDE 0.9 % IV BOLUS (SEPSIS)
1000.0000 mL | Freq: Once | INTRAVENOUS | Status: AC
  Administered 2016-10-31: 1000 mL via INTRAVENOUS

## 2016-10-31 MED ORDER — FAMOTIDINE IN NACL 20-0.9 MG/50ML-% IV SOLN
20.0000 mg | Freq: Two times a day (BID) | INTRAVENOUS | Status: DC
  Administered 2016-10-31 (×2): 20 mg via INTRAVENOUS
  Filled 2016-10-31 (×2): qty 50

## 2016-10-31 MED ORDER — FAMOTIDINE IN NACL 20-0.9 MG/50ML-% IV SOLN
20.0000 mg | Freq: Once | INTRAVENOUS | Status: AC
  Administered 2016-10-31: 20 mg via INTRAVENOUS
  Filled 2016-10-31: qty 50

## 2016-10-31 MED ORDER — ACETAMINOPHEN 325 MG PO TABS
650.0000 mg | ORAL_TABLET | Freq: Once | ORAL | Status: AC
Start: 2016-10-31 — End: 2016-10-31
  Administered 2016-10-31: 650 mg via ORAL
  Filled 2016-10-31: qty 2

## 2016-10-31 MED ORDER — LORAZEPAM 2 MG/ML IJ SOLN
1.0000 mg | Freq: Once | INTRAMUSCULAR | Status: AC
  Administered 2016-10-31: 1 mg via INTRAVENOUS
  Filled 2016-10-31: qty 1

## 2016-10-31 MED ORDER — HALOPERIDOL LACTATE 5 MG/ML IJ SOLN: 2.0000 mg | Freq: Once | INTRAMUSCULAR | Status: DC

## 2016-10-31 NOTE — Code Documentation (Signed)
Nasopharyngeal airway in place

## 2016-10-31 NOTE — ED Notes (Signed)
Attempted report 

## 2016-10-31 NOTE — ED Provider Notes (Signed)
By signing my name below, I, Linus GalasMaharshi Patel, attest that this documentation has been prepared under the direction and in the presence of Glynn OctaveStephen Kalob Bergen, MD. Electronically Signed: Linus GalasMaharshi Patel, ED Scribe. 10/31/16. 1:09 AM.  HPI Comments: Sandra Ali is a 20 y.o. female who presents to the Emergency Department with a PMHx of ADHD complaining of mental status changes prior to arrival. Pt was at found at a party with a decreased mental status and brought to the ED by bystanders. En route EMS administered narcan and zofran after which she vomited. No other complaints at this time.   Level 5 Caveat due to mental status changes.   Physical Exam Belligerent, uncooperative, writhing around the bed, not following commands, right sided gaze that crosses the midline, no meningismus, no obvious trauma, skin is cool, tachycardic.    Patient with altered mental status, hypothermia, tachycardia, urinary retention She has no clonus. No fever. No hyperreflexia or hypertonia. She is not following commands. Moves all extremities with gag intact.  No clonus, no hyperreflexia.  Suspect possible anticholinergic ingestion, alcohol ingestion. Does have urinary retention.  Workup remarkable for alcohol intoxication. CT head is negative. Patient is hypothermic. hypotension has improved with IVF. Antibiotics added for possible aspiration. Intubation was required for inability to protect airway. Supervised intubation by PAC Humes.  CRITICAL CARE Performed by: Glynn OctaveANCOUR, Tana Trefry Total critical care time: 45 minutes Critical care time was exclusive of separately billable procedures and treating other patients. Critical care was necessary to treat or prevent imminent or life-threatening deterioration. Critical care was time spent personally by me on the following activities: development of treatment plan with patient and/or surrogate as well as nursing, discussions with consultants, evaluation of patient's  response to treatment, examination of patient, obtaining history from patient or surrogate, ordering and performing treatments and interventions, ordering and review of laboratory studies, ordering and review of radiographic studies, pulse oximetry and re-evaluation of patient's condition.   I personally performed the services described in this documentation, which was scribed in my presence. The recorded information has been reviewed and is accurate.      Glynn OctaveStephen Saina Waage, MD 10/31/16 [phone]

## 2016-10-31 NOTE — Progress Notes (Signed)
Pt. Transported uneventfully from ED-POD-A-03 to 2S-04, RT covering unit made aware of arrival.

## 2016-10-31 NOTE — Code Documentation (Signed)
Pulled back tube from chest xray results

## 2016-10-31 NOTE — ED Notes (Addendum)
Delay in lab draw,  Edp, nurse and tech at bedside at this time.  Preparing to intubate pt.

## 2016-10-31 NOTE — Progress Notes (Addendum)
Am rounds  Pt weaning. Agitated on vent  Blood pressure 91/63, pulse 67, temperature (!) 95.1 F (35.1 C), temperature source Rectal, resp. rate 16, SpO2 100 %. Writhing on bed. Agitated CVS-RRR RS-Clear Abd- Soft, + BS Ext- No edema  Labs and imaging reviewed  Assessment: ETOH intoxication, OD Possible Aspiration  Observe off antibiotics Extubate  Additional critical care time- 35 min  Chilton GreathousePraveen Lineth Thielke MD [facility] Pager [phone] If no answer or after 3pm call: [phone] 10/31/2016, 9:00 AM

## 2016-10-31 NOTE — Code Documentation (Signed)
Pt has snoring respirations and coughing

## 2016-10-31 NOTE — H&P (Signed)
PULMONARY / CRITICAL CARE MEDICINE   Name: Sandra Ali MRN: [id number] DOB: 1996-01-17    ADMISSION DATE:  10/31/2016 CONSULTATION DATE:  10/31/2016  REFERRING MD: Dr. Manus Gunningancour EDP  CHIEF COMPLAINT:  AMS  HISTORY OF PRESENT ILLNESS:   20 year old female with past medical history of ADHD. Based on clinic notes that describe conversation with PCP and mother, Miss Sandra Ali demonstrates a series of erratic behaviors such as frequently moving out of her house to live with her friends and then returning, anger issues, and other "risky behaviors". 11/22 early a.m. She was at a party where she was noticed by other party goers to be lethargic and minimally responsive. EMS was contacted and in route to the emergency department she was administered Narcan and Zofran after which she promptly vomited. Upon presentation to the emergency department she was altered and eventually progressed to the point in which she was unable to protect her airway requiring intubation. Alcohol level noted to be 499 in the emergency department. Other notable laboratory findings include potassium 3.1, glucose 196, WBC 16.6, hemoglobin 16.1, platelets 420. She was initially hypotensive in the emergency department which has improved after 4 L of normal saline. Also hypothermic with a rectal temperature 71F. PCCM asked to admit.  PAST MEDICAL HISTORY :  She  has a past medical history of ADHD (09/29/2007); INSOMNIA UNSPECIFIED (02/24/2008); SCOLIOSIS, MILD (03/28/2009); SORE THROAT (11/24/2007); and UNS ADVRS EFF OTH RX MEDICINAL\T\BIOLOGICAL SBSTNC (04/30/2008).  PAST SURGICAL HISTORY: She  has no past surgical history on file.  No Known Allergies  No current facility-administered medications on file prior to encounter.    Current Outpatient Prescriptions on File Prior to Encounter  Medication Sig  . escitalopram (LEXAPRO) 10 MG tablet TAKE 1 AND 1/2 TABLET EVERY DAY  . methylphenidate (DAYTRANA) 15 mg/9hr Place 1 patch (15 mg  total) onto the skin daily. wear patch for 9 hours only each day    FAMILY HISTORY:  Her is adopted.    SOCIAL HISTORY: She  reports that she has been smoking.  She has never used smokeless tobacco. She reports that she does not drink alcohol or use drugs.  REVIEW OF SYSTEMS:   unable  SUBJECTIVE:  unable  VITAL SIGNS: BP 113/67   Pulse 89   Temp (!) 95.1 F (35.1 C) (Rectal)   Resp 16   SpO2 100%   HEMODYNAMICS:    VENTILATOR SETTINGS:    INTAKE / OUTPUT: No intake/output data recorded.  PHYSICAL EXAMINATION: General:  Young female of normal body habitus  Neuro:  Comatose on vent, gag+ HEENT:  Hazard/AT, pupils dilated, No JVD Cardiovascular:  RRR, no MRG Lungs:  Clear Abdomen:  Soft, non-distended Musculoskeletal:  No acute deformity or edema Skin:  Grossly intact  LABS:  BMET  Recent Labs Lab 10/31/16 0104 10/31/16 0117  NA 137 140  K 3.1* 3.2*  CL 106 105  CO2 20*  --   BUN 11 14  CREATININE 0.76 1.10*  GLUCOSE 196* 197*    Electrolytes  Recent Labs Lab 10/31/16 0104  CALCIUM 9.2    CBC  Recent Labs Lab 10/31/16 0104 10/31/16 0117  WBC 16.6*  --   HGB 16.1* 16.3*  HCT 46.2* 48.0*  PLT 420*  --     Coag's No results for input(s): APTT, INR in the last 168 hours.  Sepsis Markers  Recent Labs Lab 10/31/16 0118  LATICACIDVEN 4.44*    ABG No results for input(s): PHART, PCO2ART, PO2ART in the last  168 hours.  Liver Enzymes  Recent Labs Lab 10/31/16 0104  AST 24  ALT 18  ALKPHOS 104  BILITOT 0.6  ALBUMIN 4.4    Cardiac Enzymes No results for input(s): TROPONINI, PROBNP in the last 168 hours.  Glucose  Recent Labs Lab 10/31/16 0112  GLUCAP 153*    Imaging Ct Head Wo Contrast  Result Date: 10/31/2016 CLINICAL DATA:  Altered mental status EXAM: CT HEAD WITHOUT CONTRAST CT CERVICAL SPINE WITHOUT CONTRAST TECHNIQUE: Multidetector CT imaging of the head and cervical spine was performed following the standard  protocol without intravenous contrast. Multiplanar CT image reconstructions of the cervical spine were also generated. COMPARISON:  None. FINDINGS: CT HEAD FINDINGS Brain: No evidence of acute infarction, hemorrhage, hydrocephalus, extra-axial collection or mass lesion/mass effect. Vascular: No hyperdense vessel or unexpected calcification. Skull: Normal. Negative for fracture or focal lesion. Sinuses/Orbits: No acute finding. Other: None. CT CERVICAL SPINE FINDINGS Alignment: Reversal of the usual cervical lordosis without anterior subluxation. This may be due to patient positioning but ligamentous injury or muscle spasm could also have this appearance and are not excluded. C1-2 articulation appears intact. Skull base and vertebrae: No acute fracture. No primary bone lesion or focal pathologic process. Soft tissues and spinal canal: No prevertebral fluid or swelling. No visible canal hematoma. Disc levels: Disc space heights are preserved. No significant degenerative changes. Upper chest: Negative. Other: None. IMPRESSION: No acute intracranial abnormalities. Nonspecific reversal of the usual cervical lordosis. No acute displaced fractures identified. Electronically Signed   By: Burman Nieves M.D.   On: 10/31/2016 02:31   Ct Cervical Spine Wo Contrast  Result Date: 10/31/2016 CLINICAL DATA:  Altered mental status EXAM: CT HEAD WITHOUT CONTRAST CT CERVICAL SPINE WITHOUT CONTRAST TECHNIQUE: Multidetector CT imaging of the head and cervical spine was performed following the standard protocol without intravenous contrast. Multiplanar CT image reconstructions of the cervical spine were also generated. COMPARISON:  None. FINDINGS: CT HEAD FINDINGS Brain: No evidence of acute infarction, hemorrhage, hydrocephalus, extra-axial collection or mass lesion/mass effect. Vascular: No hyperdense vessel or unexpected calcification. Skull: Normal. Negative for fracture or focal lesion. Sinuses/Orbits: No acute finding.  Other: None. CT CERVICAL SPINE FINDINGS Alignment: Reversal of the usual cervical lordosis without anterior subluxation. This may be due to patient positioning but ligamentous injury or muscle spasm could also have this appearance and are not excluded. C1-2 articulation appears intact. Skull base and vertebrae: No acute fracture. No primary bone lesion or focal pathologic process. Soft tissues and spinal canal: No prevertebral fluid or swelling. No visible canal hematoma. Disc levels: Disc space heights are preserved. No significant degenerative changes. Upper chest: Negative. Other: None. IMPRESSION: No acute intracranial abnormalities. Nonspecific reversal of the usual cervical lordosis. No acute displaced fractures identified. Electronically Signed   By: Burman Nieves M.D.   On: 10/31/2016 02:31   Dg Chest Portable 1 View  Result Date: 10/31/2016 CLINICAL DATA:  Intubated. EXAM: PORTABLE CHEST 1 VIEW COMPARISON:  10/31/2016 FINDINGS: Endotracheal tube is been placed with tip in the right mainstem bronchus. There is collapse of the left lung. Repositioning is recommended. A more recent image obtained demonstrates that the tube has been pulled back prior to this dictation. Enteric tube with tip in the left upper quadrant consistent with location in the upper stomach. Right lung is clear and expanded. Heart size is normal. IMPRESSION: Endotracheal tube in the right mainstem bronchus with collapse of the left upper lung. Electronically Signed   By: Marisa Cyphers.D.  On: 10/31/2016 04:54   Dg Chest Port 1 View  Result Date: 10/31/2016 CLINICAL DATA:  Acute onset of vomiting and altered mental status. Possible aspiration. Initial encounter. EXAM: PORTABLE CHEST 1 VIEW COMPARISON:  None. FINDINGS: The lungs are mildly hypoexpanded but appear grossly clear. There is no evidence of focal opacification, pleural effusion or pneumothorax. The cardiomediastinal silhouette is within normal limits. No acute  osseous abnormalities are seen. The stomach is largely filled with air. IMPRESSION: Lungs mildly hypoexpanded but grossly clear. Electronically Signed   By: Roanna RaiderJeffery  Chang M.D.   On: 10/31/2016 01:21     STUDIES:  CT head Cspine 11/22 >  CULTURES: BCx2 11/22 > Sputum 11/22 >  ANTIBIOTICS: Zosyn and vancomycin in ED Unasyn 11/22 >  SIGNIFICANT EVENTS: 11/22 admit for etoh, intubated  LINES/TUBES: ETT 11/22 >  DISCUSSION: 20 year old female with history of ADHD and "risky behavior" as unresponsive at a party and friends called EMS. She was intubated in the emergency department for airway protection. Alcohol level 499. Did have some vomiting with concern for aspiration. Initially hypotensive but this improved with IV fluids. Will admit to ICU for close monitoring.  ASSESSMENT / PLAN:  PULMONARY A: Inability to protect airway in setting ETOH intoxication P:   Full vent support VAP bundle ABG CXR  CARDIOVASCULAR A:  Hypotension in setting dehydration > improved Elevated lactic acid P:  Continue gentle IVF Telemetry Ensure lactic clearing  RENAL A:   Hypokalemia  P:   Replete K  GASTROINTESTINAL A:   No acute issues  P:   NPO Pepcid  HEMATOLOGIC A:   Hemoconcentration  P:  Continue IVF  INFECTIOUS A:   Possible aspiration  P:   Unasyn per pharmacy  ENDOCRINE A:   Hyperglycemia without history DM   P:   Follow glucose on chemistry  NEUROLOGIC A:   Acute toxic encephalopathy due to etoh P:   RASS goal: 0 PRN fentanyl Precedex if needs additional sedation.  Thiamine Folate    FAMILY  - Updates: Mother on way  - Inter-disciplinary family meet or Palliative Care meeting due by:  11/29    Joneen RoachPaul Ling Flesch, AGACNP-BC [facility] Pager [phone] or [phone]  10/31/2016 5:15 AM

## 2016-10-31 NOTE — ED Notes (Signed)
Pt found wandering down the road alone after being seen at a party. Pt soaked from head to toe. Pt does not sell of urine. Pt unresponsive and incomprehensible at times. Pt failing about in the bed at times. Staff padded the bed rail with sheets to prevent head trauma. Pt identified through her ID in her wallet. Clothing removed via scissors. Pt transferred

## 2016-10-31 NOTE — Code Documentation (Signed)
Intubation by PA and Dr Manus Gunningancour

## 2016-10-31 NOTE — Procedures (Signed)
Extubation Procedure Note  Patient Details:   Name: Sandra Ali DOB: [birth year] MRN: [id number]   Airway Documentation:     Evaluation  O2 sats: stable throughout Complications: No apparent complications Patient did tolerate procedure well. Bilateral Breath Sounds: Clear   Yes   Patient extubated per MD order.  Positive cuff leak noted.  No evidence of stridor.  Patient able to speak post extubation.  Sats, on room air, currently 100%.  Vitals are stable.  No apparent complications.  Durwin GlazeBrown, Verlee Pope N 10/31/2016, 9:16 AM

## 2016-10-31 NOTE — ED Notes (Signed)
Pt showed up at party where she knew some bystanders, became lethargic and became combative, bystanders dropped her off here.

## 2016-10-31 NOTE — ED Notes (Signed)
Spoke with Mother Sandra Ali and she is on way to hospital.

## 2016-10-31 NOTE — ED Provider Notes (Signed)
[facility]-EMERGENCY DEPT Provider Note   CSN: [id number] Arrival date & time: 10/31/16  0102    History   Chief Complaint No chief complaint on file.   LEVEL 5 CAVEAT SECONDARY TO AMS & ACUITY OF CONDITION  HPI Sandra Ali is a 20 y.o. female.  20 year old female with a history of ADHD presents to the emergency department for altered mental status. Level V caveat applies. Per triage nurse, patient was walking outside tonight. She was found at a party by bystanders. Suspected alcohol ingestion. Bystanders report that they were going to take her home, but the patient became combative. They decided to bring her to the emergency department for evaluation instead.  PCP - Dr. Fabian SharpPanosh      Past Medical History:  Diagnosis Date  . ADHD 09/29/2007  . INSOMNIA UNSPECIFIED 02/24/2008  . SCOLIOSIS, MILD 03/28/2009  . SORE THROAT 11/24/2007  . UNS ADVRS EFF OTH RX MEDICINAL\T\BIOLOGICAL SBSTNC 04/30/2008    Patient Active Problem List   Diagnosis Date Noted  . Tobacco dependence 03/29/2016  . Visit for preventive health examination 01/28/2015  . Attention deficit hyperactivity disorder 01/28/2015  . Adjustment disorder with anxious mood 11/23/2014  . Well adolescent visit 09/07/2013  . Medication management 03/13/2012  . SCOLIOSIS, MILD 03/28/2009  . UNS ADVRS EFF OTH RX MEDICINAL&BIOLOGICAL SBSTNC 04/30/2008  . INSOMNIA UNSPECIFIED 02/24/2008  . Attention deficit hyperactivity disorder (ADHD) 09/29/2007    No past surgical history on file.  OB History    No data available       Home Medications    Prior to Admission medications   Medication Sig Start Date End Date Taking? Authorizing Provider  escitalopram (LEXAPRO) 10 MG tablet TAKE 1 AND 1/2 TABLET EVERY DAY 09/27/16   Madelin HeadingsWanda K Panosh, MD  methylphenidate [facility](DAYTRANA) 15 mg/9hr Place 1 patch (15 mg total) onto the skin daily. wear patch for 9 hours only each day 09/27/16   Madelin HeadingsWanda K Panosh, MD    Family History Family  History  Problem Relation Age of Onset  . Adopted: Yes    Social History Social History  Substance Use Topics  . Smoking status: Current Every Day Smoker  . Smokeless tobacco: Never Used  . Alcohol use No     Allergies   Patient has no known allergies.   Review of Systems Review of Systems  Unable to perform ROS: Acuity of condition     Physical Exam Updated Vital Signs BP (!) 90/44   Pulse 73   Temp (!) 95.1 F (35.1 C) (Rectal)   Resp 18   SpO2 100%   Physical Exam  Constitutional: She appears well-developed and well-nourished. No distress.  Patient altered. Breath smells of ETOH. Foam in oropharynx. Writhing around the bed.  HENT:  Head: Normocephalic and atraumatic.  Eyes: Conjunctivae and EOM are normal. Pupils are equal, round, and reactive to light. No scleral icterus.  Spontaneous lateral gaze to the right. Eyes will cross the midline.  Neck: Normal range of motion.  No meningismus  Cardiovascular: Regular rhythm and intact distal pulses.   Tachycardia to 125bpm  Pulmonary/Chest: Effort normal. No respiratory distress. She has no wheezes. She has no rales.  Lungs CTAB  Abdominal: Soft. She exhibits no distension and no mass. There is no tenderness. There is no guarding.  Soft, nontender abdomen.  Musculoskeletal: Normal range of motion.  Neurological: She is alert.  Patient with nonsensical speech. Will withdraw from painful stimuli. Will not follow commands. Gag intact. GCS 10. Patient  moving all extremities.  Skin: Skin is dry. No rash noted. She is not diaphoretic. No erythema. No pallor.  Psychiatric: She has a normal mood and affect. Her behavior is normal.  Nursing note and vitals reviewed.    ED Treatments / Results  Labs (all labs ordered are listed, but only abnormal results are displayed) Labs Reviewed  CBC WITH DIFFERENTIAL/PLATELET - Abnormal; Notable for the following:       Result Value   WBC 16.6 (*)    RBC 5.31 (*)    Hemoglobin  16.1 (*)    HCT 46.2 (*)    Platelets 420 (*)    Neutro Abs 8.9 (*)    Lymphs Abs 5.8 (*)    Monocytes Absolute 1.2 (*)    Basophils Absolute 0.2 (*)    All other components within normal limits  COMPREHENSIVE METABOLIC PANEL - Abnormal; Notable for the following:    Potassium 3.1 (*)    CO2 20 (*)    Glucose, Bld 196 (*)    All other components within normal limits  ETHANOL - Abnormal; Notable for the following:    Alcohol, Ethyl (B) 499 (*)    All other components within normal limits  ACETAMINOPHEN LEVEL - Abnormal; Notable for the following:    Acetaminophen (Tylenol), Serum <10 (*)    All other components within normal limits  I-STAT CHEM 8, ED - Abnormal; Notable for the following:    Potassium 3.2 (*)    Creatinine, Ser 1.10 (*)    Glucose, Bld 197 (*)    Calcium, Ion 1.11 (*)    Hemoglobin 16.3 (*)    HCT 48.0 (*)    All other components within normal limits  I-STAT CG4 LACTIC ACID, ED - Abnormal; Notable for the following:    Lactic Acid, Venous 4.44 (*)    All other components within normal limits  CBG MONITORING, ED - Abnormal; Notable for the following:    Glucose-Capillary 153 (*)    All other components within normal limits  CULTURE, BLOOD (ROUTINE X 2)  CULTURE, BLOOD (ROUTINE X 2)  RAPID URINE DRUG SCREEN, HOSP PERFORMED  SALICYLATE LEVEL  CK  URINALYSIS, ROUTINE W REFLEX MICROSCOPIC ([facility])  I-STAT BETA HCG BLOOD, ED ([facility], [facility], [facility] ONLY)  I-STAT CG4 LACTIC ACID, ED  I-STAT ARTERIAL BLOOD GAS, ED    EKG  EKG Interpretation  Date/Time:  Wednesday October 31 2016 01:04:50 EST Ventricular Rate:  119 PR Interval:    QRS Duration: 88 QT Interval:  356 QTC Calculation: 501 R Axis:   75 Text Interpretation:  Sinus tachycardia Probable left atrial enlargement Nonspecific repol abnormality, diffuse leads Prolonged QT interval No previous ECGs available Confirmed by Manus GunningANCOUR  MD, STEPHEN [phone]([id number]) on 10/31/2016 1:15:27 AM       Radiology Ct Head Wo  Contrast  Result Date: 10/31/2016 CLINICAL DATA:  Altered mental status EXAM: CT HEAD WITHOUT CONTRAST CT CERVICAL SPINE WITHOUT CONTRAST TECHNIQUE: Multidetector CT imaging of the head and cervical spine was performed following the standard protocol without intravenous contrast. Multiplanar CT image reconstructions of the cervical spine were also generated. COMPARISON:  None. FINDINGS: CT HEAD FINDINGS Brain: No evidence of acute infarction, hemorrhage, hydrocephalus, extra-axial collection or mass lesion/mass effect. Vascular: No hyperdense vessel or unexpected calcification. Skull: Normal. Negative for fracture or focal lesion. Sinuses/Orbits: No acute finding. Other: None. CT CERVICAL SPINE FINDINGS Alignment: Reversal of the usual cervical lordosis without anterior subluxation. This may be due to patient positioning but  ligamentous injury or muscle spasm could also have this appearance and are not excluded. C1-2 articulation appears intact. Skull base and vertebrae: No acute fracture. No primary bone lesion or focal pathologic process. Soft tissues and spinal canal: No prevertebral fluid or swelling. No visible canal hematoma. Disc levels: Disc space heights are preserved. No significant degenerative changes. Upper chest: Negative. Other: None. IMPRESSION: No acute intracranial abnormalities. Nonspecific reversal of the usual cervical lordosis. No acute displaced fractures identified. Electronically Signed   By: Burman Nieves M.D.   On: 10/31/2016 02:31   Ct Cervical Spine Wo Contrast  Result Date: 10/31/2016 CLINICAL DATA:  Altered mental status EXAM: CT HEAD WITHOUT CONTRAST CT CERVICAL SPINE WITHOUT CONTRAST TECHNIQUE: Multidetector CT imaging of the head and cervical spine was performed following the standard protocol without intravenous contrast. Multiplanar CT image reconstructions of the cervical spine were also generated. COMPARISON:  None. FINDINGS: CT HEAD FINDINGS Brain: No evidence of  acute infarction, hemorrhage, hydrocephalus, extra-axial collection or mass lesion/mass effect. Vascular: No hyperdense vessel or unexpected calcification. Skull: Normal. Negative for fracture or focal lesion. Sinuses/Orbits: No acute finding. Other: None. CT CERVICAL SPINE FINDINGS Alignment: Reversal of the usual cervical lordosis without anterior subluxation. This may be due to patient positioning but ligamentous injury or muscle spasm could also have this appearance and are not excluded. C1-2 articulation appears intact. Skull base and vertebrae: No acute fracture. No primary bone lesion or focal pathologic process. Soft tissues and spinal canal: No prevertebral fluid or swelling. No visible canal hematoma. Disc levels: Disc space heights are preserved. No significant degenerative changes. Upper chest: Negative. Other: None. IMPRESSION: No acute intracranial abnormalities. Nonspecific reversal of the usual cervical lordosis. No acute displaced fractures identified. Electronically Signed   By: Burman Nieves M.D.   On: 10/31/2016 02:31   Dg Chest Port 1 View  Result Date: 10/31/2016 CLINICAL DATA:  Acute onset of vomiting and altered mental status. Possible aspiration. Initial encounter. EXAM: PORTABLE CHEST 1 VIEW COMPARISON:  None. FINDINGS: The lungs are mildly hypoexpanded but appear grossly clear. There is no evidence of focal opacification, pleural effusion or pneumothorax. The cardiomediastinal silhouette is within normal limits. No acute osseous abnormalities are seen. The stomach is largely filled with air. IMPRESSION: Lungs mildly hypoexpanded but grossly clear. Electronically Signed   By: Roanna Raider M.D.   On: 10/31/2016 01:21    Procedures Procedures (including critical care time)  Medications Ordered in ED Medications  etomidate (AMIDATE) injection (20 mg Intravenous Given 10/31/16 0425)  succinylcholine (ANECTINE) injection (100 mg Intravenous Given 10/31/16 0426)  fentaNYL  (SUBLIMAZE) injection 50 mcg (not administered)  fentaNYL in NS (31mcg/ml) infusion-PREMIX (not administered)  fentaNYL (SUBLIMAZE) bolus via infusion 50 mcg (not administered)  midazolam (VERSED) injection 2 mg (not administered)  midazolam (VERSED) injection 2 mg (not administered)  0.9 %  sodium chloride infusion (not administered)  sodium chloride 0.9 % bolus 1,000 mL (0 mLs Intravenous Stopped 10/31/16 0234)  naloxone (NARCAN) injection 0.4 mg (0.4 mg Intravenous Given 10/31/16 0102)  ondansetron (ZOFRAN) injection 4 mg (4 mg Intravenous Given 10/31/16 0102)  famotidine (PEPCID) IVPB 20 mg premix (0 mg Intravenous Stopped 10/31/16 0234)  LORazepam (ATIVAN) injection 1 mg (1 mg Intravenous Given 10/31/16 0131)  ziprasidone (GEODON) injection 10 mg (10 mg Intramuscular Given 10/31/16 0133)  diphenhydrAMINE (BENADRYL) injection 50 mg (50 mg Intravenous Given 10/31/16 0200)  haloperidol lactate (HALDOL) injection 5 mg (5 mg Intravenous Given 10/31/16 0200)  sodium chloride 0.9 %  bolus 2,000 mL (2,000 mLs Intravenous New Bag/Given 10/31/16 0301)    INTUBATION Performed by: Antony Madura  Required items: required blood products, implants, devices, and special equipment available Patient identity confirmed: provided demographic data and hospital-assigned identification number Time out: Immediately prior to procedure a "time out" was called to verify the correct patient, procedure, equipment, support staff and site/side marked as required.  Indications: AMS, snoring respirations, GCS <8  Intubation method: Glidescope Laryngoscopy   Preoxygenation: BVM and continuous 15L on [place]  Sedatives: 20mg  Etomidate Paralytic: 100mg  Succinylcholine  Tube Size: 7.5 cuffed  Post-procedure assessment: chest rise and ETCO2 monitor Breath sounds: equal and absent over the epigastrium Tube secured with: ETT holder Chest x-ray interpreted by radiologist and me.  Chest x-ray findings:  Initial ET tube with right mainstem intubation on CXR. ET tube retracted and appears in appropriate position on repeat CXR. Equal breath sounds bilaterally on auscultation. Positioning is 20 at the lip.  Patient tolerated the procedure well with no immediate complications.    CRITICAL CARE Performed by: Antony Madura   Total critical care time: 60 minutes  Critical care time was exclusive of separately billable procedures and treating other patients.  Critical care was necessary to treat or prevent imminent or life-threatening deterioration.  Critical care was time spent personally by me on the following activities: development of treatment plan with patient and/or surrogate as well as nursing, discussions with consultants, evaluation of patient's response to treatment, examination of patient, obtaining history from patient or surrogate, ordering and performing treatments and interventions, ordering and review of laboratory studies, ordering and review of radiographic studies, pulse oximetry and re-evaluation of patient's condition.   Initial Impression / Assessment and Plan / ED Course  I have reviewed the triage vital signs and the nursing notes.  Pertinent labs & imaging results that were available during my care of the patient were reviewed by me and considered in my medical decision making (see chart for details).  Clinical Course     1:20 AM CBG 153. Patient altered. Vomiting noted after arrival. Patient agitated, moving around the exam room bed. Breath smells of ETOH. No other known ingestions and no response to 0.4mg  Narcan. Remainder of work up pending. CXR ordered to ensure no evidence of aspiration.  1:58 AM Patient remains agitated, writhing around in the bed. Persistent tachycardia. Patient no longer hypotensive. Speech nonsensical. Concern for harm to self and loss of IV access given frequent movements. 5mg  Haldol and Benadryl 50mg  IV added for management of  agitation.  2:32 AM HR 78bpm; BP 95/59. Patient now sleeping, in NAD. CT head obtained and pending. UDS sent.  2:58 AM Notified by nurse that patient had approximately 1 L urine output on in and out catheter. Given agitation and tachycardia, with urinary retention, question anticholinergic overdose. Patient is not hypertonic. She has no clonus. She is not hyperreflexive. 2L IVF hung for mild hypotension. Normal BP at outpatient visits are 110-115 systolic; now 90 systolic while asleep. No compensatory tachycardia.  3:39 AM Patient with ethanol of 499. Plan for admission for close monitoring and sobering. Patient would likely benefit from psychiatric evaluation when awake. Highly suspicious for intentional overdose.  4:05 AM Case discussed with Dr. Katrinka Blazing who will admit.  4:35 AM Patient now with snoring respirations, not protecting airway. More somnolent. Patient intubated for airway protection. Vanc/Zosyn initiated and blood cultures added. Will consult PCCM.  4:49 AM PCCM at bedside.   Final Clinical Impressions(s) / ED Diagnoses  Final diagnoses:  Altered mental status, unspecified altered mental status type  Alcoholic intoxication with complication [facility])    New Prescriptions New Prescriptions   No medications on file     Antony Madura, PA-C 10/31/16 0406    Antony Madura, PA-C 10/31/16 0406    Antony Madura, PA-C 10/31/16 [id number]    Glynn Octave, MD 10/31/16 [phone]

## 2016-10-31 NOTE — Progress Notes (Signed)
C/o headache Labs as below   Recent Labs Lab 10/31/16 0104 10/31/16 0117 10/31/16 0531  NA 137 140  --   K 3.1* 3.2*  --   CL 106 105  --   CO2 20*  --   --   GLUCOSE 196* 197*  --   BUN 11 14  --   CREATININE 0.76 1.10*  --   CALCIUM 9.2  --   --   MG  --   --  1.9  PHOS  --   --  3.7    Low k Low mag Headache  Plan  k. Mag repletion and tylenol for headache abd check LFT  Dr. Kalman ShanMurali Amylah Will, M.D., [facility].C.P Pulmonary and Critical Care Medicine Staff Physician [facility] [place] [facility] Pager: [phone], If no answer or between  15:00h - 7:00h: call [phone]  10/31/2016 8:53 PM

## 2016-10-31 NOTE — Code Documentation (Signed)
Pt temp 93.6 pt on bearhugger

## 2016-11-01 DIAGNOSIS — R198 Other specified symptoms and signs involving the digestive system and abdomen: Secondary | ICD-10-CM

## 2016-11-01 DIAGNOSIS — F17210 Nicotine dependence, cigarettes, uncomplicated: Secondary | ICD-10-CM

## 2016-11-01 DIAGNOSIS — F902 Attention-deficit hyperactivity disorder, combined type: Secondary | ICD-10-CM

## 2016-11-01 DIAGNOSIS — Z810 Family history of intellectual disabilities: Secondary | ICD-10-CM

## 2016-11-01 DIAGNOSIS — F912 Conduct disorder, adolescent-onset type: Secondary | ICD-10-CM

## 2016-11-01 DIAGNOSIS — Z79899 Other long term (current) drug therapy: Secondary | ICD-10-CM

## 2016-11-01 DIAGNOSIS — F10920 Alcohol use, unspecified with intoxication, uncomplicated: Secondary | ICD-10-CM

## 2016-11-01 LAB — CBC WITH DIFFERENTIAL/PLATELET
BASOS ABS: 0 10*3/uL (ref 0.0–0.1)
BASOS PCT: 0 %
Eosinophils Absolute: 0.2 10*3/uL (ref 0.0–0.7)
Eosinophils Relative: 2 %
HEMATOCRIT: 39.5 % (ref 36.0–46.0)
HEMOGLOBIN: 13.6 g/dL (ref 12.0–15.0)
Lymphocytes Relative: 39 %
Lymphs Abs: 2.6 10*3/uL (ref 0.7–4.0)
MCH: 30 pg (ref 26.0–34.0)
MCHC: 34.4 g/dL (ref 30.0–36.0)
MCV: 87.2 fL (ref 78.0–100.0)
Monocytes Absolute: 0.8 10*3/uL (ref 0.1–1.0)
Monocytes Relative: 12 %
NEUTROS ABS: 3.1 10*3/uL (ref 1.7–7.7)
NEUTROS PCT: 47 %
Platelets: 283 10*3/uL (ref 150–400)
RBC: 4.53 MIL/uL (ref 3.87–5.11)
RDW: 12.2 % (ref 11.5–15.5)
WBC: 6.6 10*3/uL (ref 4.0–10.5)

## 2016-11-01 LAB — BASIC METABOLIC PANEL
ANION GAP: 8 (ref 5–15)
BUN: <5 mg/dL — ABNORMAL LOW (ref 6–20)
CALCIUM: 8.2 mg/dL — AB (ref 8.9–10.3)
CHLORIDE: 111 mmol/L (ref 101–111)
CO2: 22 mmol/L (ref 22–32)
Creatinine, Ser: 0.77 mg/dL (ref 0.44–1.00)
GFR calc non Af Amer: >60 mL/min (ref >60)
Glucose, Bld: 98 mg/dL (ref 65–99)
Potassium: 4 mmol/L (ref 3.5–5.1)
Sodium: 141 mmol/L (ref 135–145)

## 2016-11-01 LAB — HEPATIC FUNCTION PANEL
ALT: 16 U/L (ref 14–54)
AST: 21 U/L (ref 15–41)
Albumin: 3.1 g/dL — ABNORMAL LOW (ref 3.5–5.0)
Alkaline Phosphatase: 71 U/L (ref 38–126)
Bilirubin, Direct: 0.2 mg/dL (ref 0.1–0.5)
Indirect Bilirubin: 0.6 mg/dL (ref 0.3–0.9)
Total Bilirubin: 0.8 mg/dL (ref 0.3–1.2)
Total Protein: 5.2 g/dL — ABNORMAL LOW (ref 6.5–8.1)

## 2016-11-01 LAB — MAGNESIUM: Magnesium: 2.1 mg/dL (ref 1.7–2.4)

## 2016-11-01 LAB — PHOSPHORUS: PHOSPHORUS: 3.3 mg/dL (ref 2.5–4.6)

## 2016-11-01 LAB — MRSA PCR SCREENING: MRSA by PCR: NEGATIVE

## 2016-11-01 MED ORDER — NICOTINE 21 MG/24HR TD PT24
21.0000 mg | MEDICATED_PATCH | Freq: Every day | TRANSDERMAL | Status: DC
  Administered 2016-11-01: 21 mg via TRANSDERMAL
  Filled 2016-11-01: qty 1

## 2016-11-01 NOTE — Consult Note (Signed)
[facility] Consult   Reason for Consult:  Alcohol intoxication Referring Physician:  Dr. Lorre Munroe Patient Identification: Sandra Ali MRN:  [id number] Principal Diagnosis: <principal problem not specified> Diagnosis:   Patient Active Problem List   Diagnosis Date Noted  . Altered mental status [R41.82] 10/31/2016  . Respiratory failure ([place]) [J96.90] 10/31/2016  . Alcohol intoxication ([place]) [F10.929] 10/31/2016  . Tobacco dependence [F17.200] 03/29/2016  . Visit for preventive health examination [Z00.00] 01/28/2015  . Attention deficit hyperactivity disorder [F90.9] 01/28/2015  . Adjustment disorder with anxious mood [F43.22] 11/23/2014  . Well adolescent visit [Z00.129] 09/07/2013  . Medication management [Z79.899] 03/13/2012  . SCOLIOSIS, MILD [M41.20] 03/28/2009  . UNS ADVRS EFF OTH RX MEDICINAL&BIOLOGICAL SBSTNC [T50.995A] 04/30/2008  . INSOMNIA UNSPECIFIED [G47.00] 02/24/2008  . Attention deficit hyperactivity disorder (ADHD) [F90.9] 09/29/2007    Total Time spent with patient: 45 minutes  Subjective:   KATHERINA WIMER is a 20 y.o. female patient admitted with alcohol intoxication.  HPI: Brenlee Koskela is a 20 years old female admitted to [facility] with altered mental status secondary to alcoholic intoxication and reportedly had a blood alcohol level is 499 on arrival. Patient is seen, chart reviewed and case discussed with the patient and her mother who is at bedside with patient consent. Patient reported she went to a party and there is a lot of alcohol over there and reportedly she had binge drinking and her friends brought her to the emergency department. Patient mother reported she has been suffering with attention deficit hyperactivity disorder, depression, oppositional defiant behavior, conduct disorder symptoms like stealing and running away from home 2 weeks to 6 weeks at a time staying with her friends until she comes back home.  Patient reported she does not get along with her mother and her sister. She also has a problem with her boyfriend who has been diagnosed with unknown mental disorder and substance abuse. Patient reportedly graduated from high school at [employer] but failed twice the college classes because she's not interestedand has been involved with friends who has been doing drugs and alcohol. Patient denies current symptoms of depression, anxiety, bipolar mania, auditory/visual hallucinations, delusions or paranoia. Patient endorses he has problem with the drinking and seeking for rehabilitation services once medically stable. Patient has no suicidal/homicidal ideation, intention or plans.  Past Psychiatric History: ADHD and alcohol intoxication but no history of acute psychiatric hospitalization.  Risk to Self:   Risk to Others:   Prior Inpatient Therapy:   Prior Outpatient Therapy:    Past Medical History:  Past Medical History:  Diagnosis Date  . ADHD 09/29/2007  . INSOMNIA UNSPECIFIED 02/24/2008  . SCOLIOSIS, MILD 03/28/2009  . SORE THROAT 11/24/2007  . UNS ADVRS EFF OTH RX MEDICINAL\T\BIOLOGICAL SBSTNC 04/30/2008   History reviewed. No pertinent surgical history. Family History:  Family History  Problem Relation Age of Onset  . Adopted: Yes   Family Psychiatric  History: Patient was adopted when she was 20 years old from [place] and reportedly patient biological mother has been suffered with learning disabilities. Social History:  History  Alcohol Use No     History  Drug Use No    Social History   Social History  . Marital status: Single    Spouse name: N/A  . Number of children: N/A  . Years of education: N/A   Social History Main Topics  . Smoking status: Current Every Day Smoker  . Smokeless tobacco: Never Used  . Alcohol  use No  . Drug use: No  . Sexual activity: Not Asked   Other Topics Concern  . None   Social History Narrative   HH of 3   No tobacco    Adopted from  [place]   [employer] in 8th grade   Now [employer] program   Just got a job at  [employer].   Additional Social History:    Allergies:  No Known Allergies  Labs:  Results for orders placed or performed during the hospital encounter of 10/31/16 (from the past 48 hour(s))  CBC with Differential     Status: Abnormal   Collection Time: 10/31/16  1:04 AM  Result Value Ref Range   WBC 16.6 (H) 4.0 - 10.5 K/uL   RBC 5.31 (H) 3.87 - 5.11 MIL/uL   Hemoglobin 16.1 (H) 12.0 - 15.0 g/dL   HCT 46.2 (H) 36.0 - 46.0 %   MCV 87.0 78.0 - 100.0 fL   MCH 30.3 26.0 - 34.0 pg   MCHC 34.8 30.0 - 36.0 g/dL   RDW 12.1 11.5 - 15.5 %   Platelets 420 (H) 150 - 400 K/uL   Neutrophils Relative % 54 %   Lymphocytes Relative 35 %   Monocytes Relative 7 %   Eosinophils Relative 3 %   Basophils Relative 1 %   Neutro Abs 8.9 (H) 1.7 - 7.7 K/uL   Lymphs Abs 5.8 (H) 0.7 - 4.0 K/uL   Monocytes Absolute 1.2 (H) 0.1 - 1.0 K/uL   Eosinophils Absolute 0.5 0.0 - 0.7 K/uL   Basophils Absolute 0.2 (H) 0.0 - 0.1 K/uL  Comprehensive metabolic panel     Status: Abnormal   Collection Time: 10/31/16  1:04 AM  Result Value Ref Range   Sodium 137 135 - 145 mmol/L   Potassium 3.1 (L) 3.5 - 5.1 mmol/L   Chloride 106 101 - 111 mmol/L   CO2 20 (L) 22 - 32 mmol/L   Glucose, Bld 196 (H) 65 - 99 mg/dL   BUN 11 6 - 20 mg/dL   Creatinine, Ser 0.76 0.44 - 1.00 mg/dL   Calcium 9.2 8.9 - 10.3 mg/dL   Total Protein 6.7 6.5 - 8.1 g/dL   Albumin 4.4 3.5 - 5.0 g/dL   AST 24 15 - 41 U/L   ALT 18 14 - 54 U/L   Alkaline Phosphatase 104 38 - 126 U/L   Total Bilirubin 0.6 0.3 - 1.2 mg/dL   GFR calc non Af Amer >60 >60 mL/min   GFR calc Af Amer >60 >60 mL/min    Comment: (NOTE) The eGFR has been calculated using the CKD EPI equation. This calculation has not been validated in all clinical situations. eGFR's persistently <60 mL/min signify possible Chronic Kidney Disease.    Anion gap 11 5 - 15  Ethanol      Status: Abnormal   Collection Time: 10/31/16  1:04 AM  Result Value Ref Range   Alcohol, Ethyl (B) 499 (HH) <5 mg/dL    Comment:        LOWEST DETECTABLE LIMIT FOR SERUM ALCOHOL IS 5 mg/dL FOR MEDICAL PURPOSES ONLY CRITICAL RESULT CALLED TO, READ BACK BY AND VERIFIED WITH: FERRAINOLO,J RN 10/31/2016 0316 JORDANS   Acetaminophen level     Status: Abnormal   Collection Time: 10/31/16  1:04 AM  Result Value Ref Range   Acetaminophen (Tylenol), Serum <10 (L) 10 - 30 ug/mL    Comment:        THERAPEUTIC  CONCENTRATIONS VARY SIGNIFICANTLY. A RANGE OF 10-30 ug/mL MAY BE AN EFFECTIVE CONCENTRATION FOR MANY PATIENTS. HOWEVER, SOME ARE BEST TREATED AT CONCENTRATIONS OUTSIDE THIS RANGE. ACETAMINOPHEN CONCENTRATIONS >150 ug/mL AT 4 HOURS AFTER INGESTION AND >50 ug/mL AT 12 HOURS AFTER INGESTION ARE OFTEN ASSOCIATED WITH TOXIC REACTIONS.   Salicylate level     Status: None   Collection Time: 10/31/16  1:04 AM  Result Value Ref Range   Salicylate Lvl <2.7 2.8 - 30.0 mg/dL  CBG monitoring, ED     Status: Abnormal   Collection Time: 10/31/16  1:12 AM  Result Value Ref Range   Glucose-Capillary 153 (H) 65 - 99 mg/dL  I-Stat Beta hCG blood, ED ([facility], [facility], [facility] only)     Status: None   Collection Time: 10/31/16  1:15 AM  Result Value Ref Range   I-stat hCG, quantitative <5.0 <5 mIU/mL   Comment 3            Comment:   GEST. AGE      CONC.  (mIU/mL)   <=1 WEEK        5 - 50     2 WEEKS       50 - 500     3 WEEKS       100 - 10,000     4 WEEKS     1,000 - 30,000        FEMALE AND NON-PREGNANT FEMALE:     LESS THAN 5 mIU/mL   I-stat chem 8, ed     Status: Abnormal   Collection Time: 10/31/16  1:17 AM  Result Value Ref Range   Sodium 140 135 - 145 mmol/L   Potassium 3.2 (L) 3.5 - 5.1 mmol/L   Chloride 105 101 - 111 mmol/L   BUN 14 6 - 20 mg/dL   Creatinine, Ser 1.10 (H) 0.44 - 1.00 mg/dL   Glucose, Bld 197 (H) 65 - 99 mg/dL   Calcium, Ion 1.11 (L) 1.15 - 1.40 mmol/L   TCO2 20 0 -  100 mmol/L   Hemoglobin 16.3 (H) 12.0 - 15.0 g/dL   HCT 48.0 (H) 36.0 - 46.0 %  I-Stat CG4 Lactic Acid, ED     Status: Abnormal   Collection Time: 10/31/16  1:18 AM  Result Value Ref Range   Lactic Acid, Venous 4.44 (HH) 0.5 - 1.9 mmol/L   Comment NOTIFIED PHYSICIAN   CK     Status: None   Collection Time: 10/31/16  2:01 AM  Result Value Ref Range   Total CK 103 38 - 234 U/L  Rapid urine drug screen (hospital performed)     Status: None   Collection Time: 10/31/16  2:26 AM  Result Value Ref Range   Opiates NONE DETECTED NONE DETECTED   Cocaine NONE DETECTED NONE DETECTED   Benzodiazepines NONE DETECTED NONE DETECTED   Amphetamines NONE DETECTED NONE DETECTED   Tetrahydrocannabinol NONE DETECTED NONE DETECTED   Barbiturates NONE DETECTED NONE DETECTED    Comment:        DRUG SCREEN FOR MEDICAL PURPOSES ONLY.  IF CONFIRMATION IS NEEDED FOR ANY PURPOSE, NOTIFY LAB WITHIN 5 DAYS.        LOWEST DETECTABLE LIMITS FOR URINE DRUG SCREEN Drug Class       Cutoff (ng/mL) Amphetamine      1000 Barbiturate      200 Benzodiazepine   782 Tricyclics       423 Opiates          300  Cocaine          300 THC              50   Urinalysis, Routine w reflex microscopic (not at [facility])     Status: None   Collection Time: 10/31/16  2:26 AM  Result Value Ref Range   Color, Urine YELLOW YELLOW   APPearance CLEAR CLEAR   Specific Gravity, Urine 1.008 1.005 - 1.030   pH 5.5 5.0 - 8.0   Glucose, UA NEGATIVE NEGATIVE mg/dL   Hgb urine dipstick NEGATIVE NEGATIVE   Bilirubin Urine NEGATIVE NEGATIVE   Ketones, ur NEGATIVE NEGATIVE mg/dL   Protein, ur NEGATIVE NEGATIVE mg/dL   Nitrite NEGATIVE NEGATIVE   Leukocytes, UA NEGATIVE NEGATIVE    Comment: MICROSCOPIC NOT DONE ON URINES WITH NEGATIVE PROTEIN, BLOOD, LEUKOCYTES, NITRITE, OR GLUCOSE <1000 mg/dL.  I-Stat CG4 Lactic Acid, ED     Status: Abnormal   Collection Time: 10/31/16  5:17 AM  Result Value Ref Range   Lactic Acid, Venous 2.08 (HH) 0.5 -  1.9 mmol/L   Comment NOTIFIED PHYSICIAN   Magnesium     Status: None   Collection Time: 10/31/16  5:31 AM  Result Value Ref Range   Magnesium 1.9 1.7 - 2.4 mg/dL  Phosphorus     Status: None   Collection Time: 10/31/16  5:31 AM  Result Value Ref Range   Phosphorus 3.7 2.5 - 4.6 mg/dL  I-Stat Arterial Blood Gas, ED - (order at [facility] and [facility] only)     Status: Abnormal   Collection Time: 10/31/16  5:40 AM  Result Value Ref Range   pH, Arterial 7.277 (L) 7.350 - 7.450   pCO2 arterial 34.6 32.0 - 48.0 mmHg   pO2, Arterial 336.0 (H) 83.0 - 108.0 mmHg   Bicarbonate 16.1 (L) 20.0 - 28.0 mmol/L   TCO2 17 0 - 100 mmol/L   O2 Saturation 100.0 %   Acid-base deficit 10.0 (H) 0.0 - 2.0 mmol/L   Patient temperature 98.6 F    Collection site RADIAL, ALLEN'S TEST ACCEPTABLE    Drawn by RT    Sample type ARTERIAL   Osmolality     Status: Abnormal   Collection Time: 10/31/16  6:04 AM  Result Value Ref Range   Osmolality 423 (HH) 275 - 295 mOsm/kg    Comment: REPEATED TO VERIFY CRITICAL RESULT CALLED TO, READ BACK BY AND VERIFIED WITH: DUNN,J RN 10/31/16 AT 0712 SKEEN,P   Lactic acid, plasma     Status: None   Collection Time: 10/31/16 11:34 AM  Result Value Ref Range   Lactic Acid, Venous 1.3 0.5 - 1.9 mmol/L  MRSA PCR Screening     Status: None   Collection Time: 10/31/16 10:25 PM  Result Value Ref Range   MRSA by PCR NEGATIVE NEGATIVE    Comment:        The GeneXpert MRSA Assay (FDA approved for NASAL specimens only), is one component of a comprehensive MRSA colonization surveillance program. It is not intended to diagnose MRSA infection nor to guide or monitor treatment for MRSA infections.   Basic metabolic panel     Status: Abnormal   Collection Time: 10/31/16 11:50 PM  Result Value Ref Range   Sodium 141 135 - 145 mmol/L   Potassium 4.0 3.5 - 5.1 mmol/L    Comment: DELTA CHECK NOTED   Chloride 111 101 - 111 mmol/L   CO2 22 22 - 32 mmol/L   Glucose, Bld 98 65 -  99 mg/dL    BUN <5 (L) 6 - 20 mg/dL   Creatinine, Ser 0.77 0.44 - 1.00 mg/dL   Calcium 8.2 (L) 8.9 - 10.3 mg/dL   GFR calc non Af Amer >60 >60 mL/min   GFR calc Af Amer >60 >60 mL/min    Comment: (NOTE) The eGFR has been calculated using the CKD EPI equation. This calculation has not been validated in all clinical situations. eGFR's persistently <60 mL/min signify possible Chronic Kidney Disease.    Anion gap 8 5 - 15  Hepatic function panel     Status: Abnormal   Collection Time: 11/01/16  5:09 AM  Result Value Ref Range   Total Protein 5.2 (L) 6.5 - 8.1 g/dL   Albumin 3.1 (L) 3.5 - 5.0 g/dL   AST 21 15 - 41 U/L   ALT 16 14 - 54 U/L   Alkaline Phosphatase 71 38 - 126 U/L   Total Bilirubin 0.8 0.3 - 1.2 mg/dL   Bilirubin, Direct 0.2 0.1 - 0.5 mg/dL   Indirect Bilirubin 0.6 0.3 - 0.9 mg/dL  Magnesium     Status: None   Collection Time: 11/01/16  5:09 AM  Result Value Ref Range   Magnesium 2.1 1.7 - 2.4 mg/dL  Phosphorus     Status: None   Collection Time: 11/01/16  5:09 AM  Result Value Ref Range   Phosphorus 3.3 2.5 - 4.6 mg/dL  CBC with Differential/Platelet     Status: None   Collection Time: 11/01/16  5:09 AM  Result Value Ref Range   WBC 6.6 4.0 - 10.5 K/uL   RBC 4.53 3.87 - 5.11 MIL/uL   Hemoglobin 13.6 12.0 - 15.0 g/dL   HCT 39.5 36.0 - 46.0 %   MCV 87.2 78.0 - 100.0 fL   MCH 30.0 26.0 - 34.0 pg   MCHC 34.4 30.0 - 36.0 g/dL   RDW 12.2 11.5 - 15.5 %   Platelets 283 150 - 400 K/uL   Neutrophils Relative % 47 %   Neutro Abs 3.1 1.7 - 7.7 K/uL   Lymphocytes Relative 39 %   Lymphs Abs 2.6 0.7 - 4.0 K/uL   Monocytes Relative 12 %   Monocytes Absolute 0.8 0.1 - 1.0 K/uL   Eosinophils Relative 2 %   Eosinophils Absolute 0.2 0.0 - 0.7 K/uL   Basophils Relative 0 %   Basophils Absolute 0.0 0.0 - 0.1 K/uL    Current Facility-Administered Medications  Medication Dose Route Frequency Provider Last Rate Last Dose  . 0.9 %  sodium chloride infusion   Intravenous Continuous PRN  Ezequiel Essex, MD 100 mL/hr at 11/01/16 0600    . heparin injection 5,000 Units  5,000 Units Subcutaneous Q8H Corey Harold, NP   5,000 Units at 11/01/16 0518  . ondansetron (ZOFRAN) injection 4 mg  4 mg Intravenous Q6H PRN Corey Harold, NP        Musculoskeletal: Strength & Muscle Tone: within normal limits Gait & Station: normal Patient leans: N/A  Psychiatric Specialty Exam: Physical Exam  Eyes: Conjunctivae are normal.    ROS  Patient has mild symptoms of alcohol withdrawal at this time and no reported withdrawal seizures or hallucinations. No Fever-chills, No Headache, No changes with Vision or hearing, reports vertigo No problems swallowing food or Liquids, No Chest pain, Cough or Shortness of Breath, No Abdominal pain, No Nausea or Vommitting, Bowel movements are regular, No Blood in stool or Urine, No dysuria, No new skin rashes or bruises,  No new joints pains-aches,  No new weakness, tingling, numbness in any extremity, No recent weight gain or loss, No polyuria, polydypsia or polyphagia,   A full 10 point Review of Systems was done, except as stated above, all other Review of Systems were negative.  Blood pressure 90/60, pulse (!) 57, temperature 98.4 F (36.9 C), temperature source Oral, resp. rate 20, weight 67 kg (147 lb 11.3 oz), SpO2 97 %.Body mass index is 25.35 kg/m.  General Appearance: Casual  Eye Contact:  Good  Speech:  Clear and Coherent  Volume:  Normal  Mood:  Depressed  Affect:  Appropriate and Congruent  Thought Process:  Coherent and Goal Directed  Orientation:  Full (Time, Place, and Person)  Thought Content:  WDL  Suicidal Thoughts:  No  Homicidal Thoughts:  No  Memory:  Immediate;   Good Recent;   Fair Remote;   Fair  Judgement:  Impaired  Insight:  Fair  Psychomotor Activity:  Normal  Concentration:  Concentration: Fair and Attention Span: Fair  Recall:  Good  Fund of Knowledge:  Fair  Language:  Good  Akathisia:  Negative   Handed:  Right  AIMS (if indicated):     Assets:  Communication Skills Desire for Improvement Financial Resources/Insurance Housing Leisure Time Resilience Social Support Transportation  ADL's:  Impaired  Cognition:  WNL  Sleep:        Treatment Plan Summary: 20 years old female with the diagnosis of attention deficit hyperactivity disorder, and symptoms of conduct disorder, substance abuse and noncompliant with medication management presented with severe alcohol intoxication and reportedly binge drinking.  Patient denies current suicidal/homicidal ideation, no evidence of psychosis. Patient endorses alcohol abuse versus intoxication and seeking alcohol rehabilitation treatment Noncompliant with medication, patient is willing to be compliant with medication at this time Patient has no safety concerns  Recommended Ativan detox protocol Monitor for the alcohol withdrawal symptoms: Competency status and hallucinations  CIWA monitoring Daily contact with patient to assess and evaluate symptoms and progress in treatment and Medication management  Disposition: Patient does not meet criteria for psychiatric inpatient admission. Supportive therapy provided about ongoing stressors. Refer to IOP. or chemical dependency residential rehabilitation services  Ambrose Finland, MD 11/01/2016 10:55 AM

## 2016-11-01 NOTE — Progress Notes (Signed)
PULMONARY / CRITICAL CARE MEDICINE   Name: Sandra Ali MRN: [id number] DOB: 09-16-1996    ADMISSION DATE:  10/31/2016 CONSULTATION DATE:  10/31/2016  REFERRING MD: Dr. Manus Gunningancour EDP  CHIEF COMPLAINT:  AMS  HISTORY OF PRESENT ILLNESS:   20 year old female with past medical history of ADHD. Based on clinic notes that describe conversation with PCP and mother, Sandra Ali demonstrates a series of erratic behaviors such as frequently moving out of her house to live with her friends and then returning, anger issues, and other "risky behaviors". 11/22 early a.m. She was at a party where she was noticed by other party goers to be lethargic and minimally responsive. EMS was contacted and in route to the emergency department she was administered Narcan and Zofran after which she promptly vomited. Upon presentation to the emergency department she was altered and eventually progressed to the point in which she was unable to protect her airway requiring intubation. Alcohol level noted to be 499 in the emergency department. Other notable laboratory findings include potassium 3.1, glucose 196, WBC 16.6, hemoglobin 16.1, platelets 420. She was initially hypotensive in the emergency department which has improved after 4 L of normal saline. Also hypothermic with a rectal temperature 77F. PCCM asked to admit.  SUBJECTIVE:  Extubated yesterday.  Awake, alert.  Ate breakfast.  C/o some SOB, chest tightness and productive cough.    VITAL SIGNS: BP 97/67   Pulse 68   Temp 98.3 F (36.8 C) (Oral)   Resp (!) 25   Wt 67 kg (147 lb 11.3 oz)   SpO2 97%   BMI 25.35 kg/m   INTAKE / OUTPUT: I/O last 3 completed shifts: In: 8520 [P.O.:420; I.V.:4850; IV Piggyback:3250] Out: 450 [Urine:450]  PHYSICAL EXAMINATION: General:  Young female of normal body habitus  Neuro:  Awake alert appropriate  HEENT:  [place]/AT, pupils dilated, No JVD Cardiovascular:  RRR, no MRG Lungs:  resps even non labored on RA, few scattered  rhonchi R>L Abdomen:  Soft, non-distended Musculoskeletal:  No acute deformity or edema Skin:  Grossly intact  LABS:  BMET  Recent Labs Lab 10/31/16 0104 10/31/16 0117 10/31/16 2350  NA 137 140 141  K 3.1* 3.2* 4.0  CL 106 105 111  CO2 20*  --  22  BUN 11 14 <5*  CREATININE 0.76 1.10* 0.77  GLUCOSE 196* 197* 98    Electrolytes  Recent Labs Lab 10/31/16 0104 10/31/16 0531 10/31/16 2350 11/01/16 0509  CALCIUM 9.2  --  8.2*  --   MG  --  1.9  --  2.1  PHOS  --  3.7  --  3.3    CBC  Recent Labs Lab 10/31/16 0104 10/31/16 0117 11/01/16 0509  WBC 16.6*  --  6.6  HGB 16.1* 16.3* 13.6  HCT 46.2* 48.0* 39.5  PLT 420*  --  283    Coag's No results for input(s): APTT, INR in the last 168 hours.  Sepsis Markers  Recent Labs Lab 10/31/16 0118 10/31/16 0517 10/31/16 1134  LATICACIDVEN 4.44* 2.08* 1.3    ABG  Recent Labs Lab 10/31/16 0540  PHART 7.277*  PCO2ART 34.6  PO2ART 336.0*    Liver Enzymes  Recent Labs Lab 10/31/16 0104 11/01/16 0509  AST 24 21  ALT 18 16  ALKPHOS 104 71  BILITOT 0.6 0.8  ALBUMIN 4.4 3.1*    Cardiac Enzymes No results for input(s): TROPONINI, PROBNP in the last 168 hours.  Glucose  Recent Labs Lab 10/31/16 0112  GLUCAP  153*    Imaging No results found.   STUDIES:  CT head Cspine 11/22 >>>No acute intracranial abnormalities.  Nonspecific reversal of the usual cervical lordosis. No acute displaced fractures identified.   CULTURES: BCx2 11/22 > Sputum 11/22 >  ANTIBIOTICS: Zosyn and vancomycin in ED Unasyn 11/22 >11/22  SIGNIFICANT EVENTS: 11/22 admit for etoh, intubated  LINES/TUBES: ETT 11/22 >11/23  DISCUSSION: 20 year old female with history of ADHD and "risky behavior" as unresponsive at a party and friends called EMS. She was intubated in the emergency department for airway protection. Alcohol level 499. Did have some vomiting with concern for aspiration. Initially hypotensive but  this improved with IV fluids. Will admit to ICU for close monitoring.  ASSESSMENT / PLAN:  PULMONARY A: Inability to protect airway in setting ETOH intoxication - resolved.  P:   Pulmonary hygiene  CXR in am  unasyn was mentioned but not ordered - will hold off for now. Afebrile, wbc trending down.    CARDIOVASCULAR A:  Hypotension in setting dehydration > improved Elevated lactic acid - resolved.  P:  Continue gentle IVF  RENAL A:   Hypokalemia  P:   Replete K F/u chem in am   GASTROINTESTINAL A:   No acute issues  P:   Regular diet   HEMATOLOGIC A:   Hemoconcentration - resolved.   P:  PO intake   INFECTIOUS A:   Possible aspiration  P:   Unasyn per pharmacy --?? Not ordered.  Will hold off for now - afebrile, wbc wnl.  Eval CXR in am and monitor clinically   ENDOCRINE A:   Hyperglycemia without history DM   P:   Follow glucose on chemistry  NEUROLOGIC A:   Acute toxic encephalopathy due to etoh P:   Psych to see    Tx floor.  WIll ask [facility] to assume care in am 11/24.  Awaiting psych input - mom requests inpt psych admit.    FAMILY  - Updates: no family available 11/23.   - Inter-disciplinary family meet or Palliative Care meeting due by:  11/29    Dirk DressKaty Whiteheart, NP 11/01/2016  8:57 AM Pager: [phone] or [phone]     ATTENDING NOTE / ATTESTATION NOTE :   I have discussed the case with the resident/APP  Dirk DressKaty Whiteheart NP.   I agree with the resident/APP's  history, physical examination, assessment, and plans.    I have edited the above note and modified it according to our agreed history, physical examination, assessment and plan. Note as above. Pt admitted for ETOH abuse/intoxication. Intubated for airway protection.  Extubated and doing well. Possible depression.  Agree with floor transfer.  Will need psychiatry evaln.   Family :  No family at bedside.    Pollie MeyerJ. Angelo A de Dios, MD 11/01/2016, 6:36 PM [place]  Pulmonary and Critical Care Pager [phone] After 3 pm or if no answer, call [phone]

## 2016-11-01 NOTE — Progress Notes (Signed)
Pt admitted to 6N06 via wheelchair from 2S.  Pt AAO X4.  Pt on RA.  Pt has 18G to rt hand SL and 20G to Lt hand SL.  Family and belongings to bedside.  Report rcvd from Vernona RiegerLaura, [place].  Pt has no questions at the moment.  Will continue to monitor.

## 2016-11-02 ENCOUNTER — Inpatient Hospital Stay (HOSPITAL_COMMUNITY): Payer: 59

## 2016-11-02 DIAGNOSIS — R400 Somnolence: Secondary | ICD-10-CM

## 2016-11-02 LAB — CBC WITH DIFFERENTIAL/PLATELET
BASOS ABS: 0 10*3/uL (ref 0.0–0.1)
BASOS PCT: 0 %
Eosinophils Absolute: 0.2 10*3/uL (ref 0.0–0.7)
Eosinophils Relative: 4 %
HEMATOCRIT: 40.4 % (ref 36.0–46.0)
HEMOGLOBIN: 14.1 g/dL (ref 12.0–15.0)
LYMPHS PCT: 32 %
Lymphs Abs: 1.9 10*3/uL (ref 0.7–4.0)
MCH: 29.6 pg (ref 26.0–34.0)
MCHC: 34.9 g/dL (ref 30.0–36.0)
MCV: 84.9 fL (ref 78.0–100.0)
Monocytes Absolute: 0.8 10*3/uL (ref 0.1–1.0)
Monocytes Relative: 14 %
NEUTROS ABS: 3 10*3/uL (ref 1.7–7.7)
NEUTROS PCT: 50 %
Platelets: 304 10*3/uL (ref 150–400)
RBC: 4.76 MIL/uL (ref 3.87–5.11)
RDW: 11.7 % (ref 11.5–15.5)
WBC: 5.9 10*3/uL (ref 4.0–10.5)

## 2016-11-02 LAB — BASIC METABOLIC PANEL
ANION GAP: 10 (ref 5–15)
BUN: 7 mg/dL (ref 6–20)
CHLORIDE: 106 mmol/L (ref 101–111)
CO2: 22 mmol/L (ref 22–32)
Calcium: 9.4 mg/dL (ref 8.9–10.3)
Creatinine, Ser: 0.67 mg/dL (ref 0.44–1.00)
GFR calc non Af Amer: >60 mL/min (ref >60)
GLUCOSE: 99 mg/dL (ref 65–99)
POTASSIUM: 3.6 mmol/L (ref 3.5–5.1)
Sodium: 138 mmol/L (ref 135–145)

## 2016-11-02 LAB — MAGNESIUM: MAGNESIUM: 1.9 mg/dL (ref 1.7–2.4)

## 2016-11-02 LAB — PHOSPHORUS: PHOSPHORUS: 3.8 mg/dL (ref 2.5–4.6)

## 2016-11-02 MED ORDER — NICOTINE 14 MG/24HR TD PT24: 14.0000 mg | MEDICATED_PATCH | Freq: Every day | TRANSDERMAL | 0 refills | Status: DC

## 2016-11-02 MED ORDER — NICOTINE 14 MG/24HR TD PT24
14.0000 mg | MEDICATED_PATCH | Freq: Every day | TRANSDERMAL | Status: DC
  Administered 2016-11-02: 14 mg via TRANSDERMAL
  Filled 2016-11-02: qty 1

## 2016-11-02 MED ORDER — NICOTINE 21 MG/24HR TD PT24: 21.0000 mg | MEDICATED_PATCH | Freq: Every day | TRANSDERMAL | 0 refills | Status: DC

## 2016-11-02 NOTE — Progress Notes (Signed)
CSW received consult for IOP/Sub Abuse Res Treatment Center referrals. CSW met with pt and pt's mother @ bedside. Pt pleasant, alert and oriented X4. Pt discussed her issues with drinking, understands she was in a serious state, feels she may need to change her friends. Pt indicated she was ready to get help, CSW gave referral information for IOP/Res Sub Abuse treatment centers. CSW also provided contact information for any assistance finding a facility.  CSW is signing off.   Jusitn Salsgiver B. Joline Maxcy Clinical Social Work Dept Weekend Social Worker [phone] 3:02 PM

## 2016-11-02 NOTE — Discharge Summary (Signed)
Physician Discharge Summary  Sandra Ali MRN: [id number] DOB/AGE: Oct 26, 1996 20 y.o.  PCP: Lottie Dawson, MD   Admit date: 10/31/2016 Discharge date: 11/02/2016  Discharge Diagnoses:    Active Problems:   Altered mental status   Respiratory failure (HCC)   Alcohol intoxication ([place])    Follow-up recommendations Follow-up with PCP in 3-5 days , including all  additional recommended appointments as below Follow-up CBC, CMP in 3-5 days       Current Discharge Medication List    START taking these medications   Details  nicotine (NICODERM CQ - DOSED IN MG/24 HOURS14 mg/24hr patch Place 1 patch (14 mg total) onto the skin daily. Qty: 28 patch, Refills: 0      CONTINUE these medications which have NOT CHANGED   Details  escitalopram (LEXAPRO) 10 MG tablet TAKE 1 AND 1/2 TABLET EVERY DAY Qty: 45 tablet, Refills: 6    methylphenidate (DAYTRANA) 15 mg/9hr Place 1 patch (15 mg total) onto the skin daily. wear patch for 9 hours only each day Qty: 90 patch, Refills: 0         Discharge Condition: stable as long as patient maintains abstinence    Discharge Instructions Get Medicines reviewed and adjusted: Please take all your medications with you for your next visit with your Primary MD  Please request your Primary MD to go over all hospital tests and procedure/radiological results at the follow up, please ask your Primary MD to get all Hospital records sent to his/her office.  If you experience worsening of your admission symptoms, develop shortness of breath, life threatening emergency, suicidal or homicidal thoughts you must seek medical attention immediately by calling 911 or calling your MD immediately if symptoms less severe.  You must read complete instructions/literature along with all the possible adverse reactions/side effects for all the Medicines you take and that have been prescribed to you. Take any new Medicines after you have completely  understood and accpet all the possible adverse reactions/side effects.   Do not drive when taking Pain medications.   Do not take more than prescribed Pain, Sleep and Anxiety Medications  Special Instructions: If you have smoked or chewed Tobacco in the last 2 yrs please stop smoking, stop any regular Alcohol and or any Recreational drug use.  Wear Seat belts while driving.  Please note  You were cared for by a hospitalist during your hospital stay. Once you are discharged, your primary care physician will handle any further medical issues. Please note that NO REFILLS for any discharge medications will be authorized once you are discharged, as it is imperative that you return to your primary care physician (or establish a relationship with a primary care physician if you do not have one) for your aftercare needs so that they can reassess your need for medications and monitor your lab values.     No Known Allergies    Disposition:  Home with family    Consults:  PCCM Psychiatry   Significant Diagnostic Studies:  Dg Chest 2 View  Result Date: 11/02/2016 CLINICAL DATA:  Shortness of breath. Possible aspiration. Current smoker. EXAM: CHEST  2 VIEW COMPARISON:  10/31/2016 radiographs. FINDINGS: Interval extubation. The heart size and mediastinal contours are normal. The lungs are clear. There is no pleural effusion or pneumothorax. No acute osseous findings are identified. IMPRESSION: Interval clearing of the lungs post extubation. No active cardiopulmonary process. Electronically Signed   By: Richardean Sale M.D.   On: 11/02/2016 07:22   Ct  Head Wo Contrast  Result Date: 10/31/2016 CLINICAL DATA:  Altered mental status EXAM: CT HEAD WITHOUT CONTRAST CT CERVICAL SPINE WITHOUT CONTRAST TECHNIQUE: Multidetector CT imaging of the head and cervical spine was performed following the standard protocol without intravenous contrast. Multiplanar CT image reconstructions of the cervical  spine were also generated. COMPARISON:  None. FINDINGS: CT HEAD FINDINGS Brain: No evidence of acute infarction, hemorrhage, hydrocephalus, extra-axial collection or mass lesion/mass effect. Vascular: No hyperdense vessel or unexpected calcification. Skull: Normal. Negative for fracture or focal lesion. Sinuses/Orbits: No acute finding. Other: None. CT CERVICAL SPINE FINDINGS Alignment: Reversal of the usual cervical lordosis without anterior subluxation. This may be due to patient positioning but ligamentous injury or muscle spasm could also have this appearance and are not excluded. C1-2 articulation appears intact. Skull base and vertebrae: No acute fracture. No primary bone lesion or focal pathologic process. Soft tissues and spinal canal: No prevertebral fluid or swelling. No visible canal hematoma. Disc levels: Disc space heights are preserved. No significant degenerative changes. Upper chest: Negative. Other: None. IMPRESSION: No acute intracranial abnormalities. Nonspecific reversal of the usual cervical lordosis. No acute displaced fractures identified. Electronically Signed   By: Lucienne Capers M.D.   On: 10/31/2016 02:31   Ct Cervical Spine Wo Contrast  Result Date: 10/31/2016 CLINICAL DATA:  Altered mental status EXAM: CT HEAD WITHOUT CONTRAST CT CERVICAL SPINE WITHOUT CONTRAST TECHNIQUE: Multidetector CT imaging of the head and cervical spine was performed following the standard protocol without intravenous contrast. Multiplanar CT image reconstructions of the cervical spine were also generated. COMPARISON:  None. FINDINGS: CT HEAD FINDINGS Brain: No evidence of acute infarction, hemorrhage, hydrocephalus, extra-axial collection or mass lesion/mass effect. Vascular: No hyperdense vessel or unexpected calcification. Skull: Normal. Negative for fracture or focal lesion. Sinuses/Orbits: No acute finding. Other: None. CT CERVICAL SPINE FINDINGS Alignment: Reversal of the usual cervical lordosis  without anterior subluxation. This may be due to patient positioning but ligamentous injury or muscle spasm could also have this appearance and are not excluded. C1-2 articulation appears intact. Skull base and vertebrae: No acute fracture. No primary bone lesion or focal pathologic process. Soft tissues and spinal canal: No prevertebral fluid or swelling. No visible canal hematoma. Disc levels: Disc space heights are preserved. No significant degenerative changes. Upper chest: Negative. Other: None. IMPRESSION: No acute intracranial abnormalities. Nonspecific reversal of the usual cervical lordosis. No acute displaced fractures identified. Electronically Signed   By: Lucienne Capers M.D.   On: 10/31/2016 02:31   Dg Chest Portable 1 View  Result Date: 10/31/2016 CLINICAL DATA:  Repositioned endotracheal tube. EXAM: PORTABLE CHEST 1 VIEW COMPARISON:  10/31/2016 at 0431 hours FINDINGS: Endotracheal tube is been pulled back with tip now measuring 4.5 cm above the carina. Reinflation of the left lung. Mild residual atelectasis in the left lung base. Enteric tube tip remains in the left upper quadrant consistent with location in the stomach. Normal heart size. IMPRESSION: Appliances appear in satisfactory position with retraction of the endotracheal tube and re-expansion of the left lung. Electronically Signed   By: Lucienne Capers M.D.   On: 10/31/2016 04:55   Dg Chest Portable 1 View  Result Date: 10/31/2016 CLINICAL DATA:  Intubated. EXAM: PORTABLE CHEST 1 VIEW COMPARISON:  10/31/2016 FINDINGS: Endotracheal tube is been placed with tip in the right mainstem bronchus. There is collapse of the left lung. Repositioning is recommended. A more recent image obtained demonstrates that the tube has been pulled back prior to this dictation. Enteric tube  with tip in the left upper quadrant consistent with location in the upper stomach. Right lung is clear and expanded. Heart size is normal. IMPRESSION: Endotracheal  tube in the right mainstem bronchus with collapse of the left upper lung. Electronically Signed   By: Lucienne Capers M.D.   On: 10/31/2016 04:54   Dg Chest Port 1 View  Result Date: 10/31/2016 CLINICAL DATA:  Acute onset of vomiting and altered mental status. Possible aspiration. Initial encounter. EXAM: PORTABLE CHEST 1 VIEW COMPARISON:  None. FINDINGS: The lungs are mildly hypoexpanded but appear grossly clear. There is no evidence of focal opacification, pleural effusion or pneumothorax. The cardiomediastinal silhouette is within normal limits. No acute osseous abnormalities are seen. The stomach is largely filled with air. IMPRESSION: Lungs mildly hypoexpanded but grossly clear. Electronically Signed   By: Garald Balding M.D.   On: 10/31/2016 01:21       Filed Weights   11/01/16 0500  Weight: 67 kg (147 lb 11.3 oz)     Microbiology: Recent Results (from the past 240 hour(s))  Blood culture (routine x 2)     Status: None (Preliminary result)   Collection Time: 10/31/16  5:12 AM  Result Value Ref Range Status   Specimen Description BLOOD LEFT ARM  Final   Special Requests IN PEDIATRIC BOTTLE 1ML  Final   Culture NO GROWTH 1 DAY  Final   Report Status PENDING  Incomplete  Blood culture (routine x 2)     Status: None (Preliminary result)   Collection Time: 10/31/16  5:13 AM  Result Value Ref Range Status   Specimen Description BLOOD LEFT HAND  Final   Special Requests BOTTLES DRAWN AEROBIC AND ANAEROBIC 5ML  Final   Culture NO GROWTH 1 DAY  Final   Report Status PENDING  Incomplete  MRSA PCR Screening     Status: None   Collection Time: 10/31/16 10:25 PM  Result Value Ref Range Status   MRSA by PCR NEGATIVE NEGATIVE Final    Comment:        The GeneXpert MRSA Assay (FDA approved for NASAL specimens only), is one component of a comprehensive MRSA colonization surveillance program. It is not intended to diagnose MRSA infection nor to guide or monitor treatment for MRSA  infections.        Blood Culture    Component Value Date/Time   SDES BLOOD LEFT HAND 10/31/2016 0513   SPECREQUEST BOTTLES DRAWN AEROBIC AND ANAEROBIC 5ML 10/31/2016 0513   CULT NO GROWTH 1 DAY 10/31/2016 0513   REPTSTATUS PENDING 10/31/2016 0513      Labs: Results for orders placed or performed during the hospital encounter of 10/31/16 (from the past 48 hour(s))  Lactic acid, plasma     Status: None   Collection Time: 10/31/16 11:34 AM  Result Value Ref Range   Lactic Acid, Venous 1.3 0.5 - 1.9 mmol/L  MRSA PCR Screening     Status: None   Collection Time: 10/31/16 10:25 PM  Result Value Ref Range   MRSA by PCR NEGATIVE NEGATIVE    Comment:        The GeneXpert MRSA Assay (FDA approved for NASAL specimens only), is one component of a comprehensive MRSA colonization surveillance program. It is not intended to diagnose MRSA infection nor to guide or monitor treatment for MRSA infections.   Basic metabolic panel     Status: Abnormal   Collection Time: 10/31/16 11:50 PM  Result Value Ref Range   Sodium 141 135 -  145 mmol/L   Potassium 4.0 3.5 - 5.1 mmol/L    Comment: DELTA CHECK NOTED   Chloride 111 101 - 111 mmol/L   CO2 22 22 - 32 mmol/L   Glucose, Bld 98 65 - 99 mg/dL   BUN <5 (L) 6 - 20 mg/dL   Creatinine, Ser 0.77 0.44 - 1.00 mg/dL   Calcium 8.2 (L) 8.9 - 10.3 mg/dL   GFR calc non Af Amer >60 >60 mL/min   GFR calc Af Amer >60 >60 mL/min    Comment: (NOTE) The eGFR has been calculated using the CKD EPI equation. This calculation has not been validated in all clinical situations. eGFR's persistently <60 mL/min signify possible Chronic Kidney Disease.    Anion gap 8 5 - 15  Hepatic function panel     Status: Abnormal   Collection Time: 11/01/16  5:09 AM  Result Value Ref Range   Total Protein 5.2 (L) 6.5 - 8.1 g/dL   Albumin 3.1 (L) 3.5 - 5.0 g/dL   AST 21 15 - 41 U/L   ALT 16 14 - 54 U/L   Alkaline Phosphatase 71 38 - 126 U/L   Total Bilirubin 0.8  0.3 - 1.2 mg/dL   Bilirubin, Direct 0.2 0.1 - 0.5 mg/dL   Indirect Bilirubin 0.6 0.3 - 0.9 mg/dL  Magnesium     Status: None   Collection Time: 11/01/16  5:09 AM  Result Value Ref Range   Magnesium 2.1 1.7 - 2.4 mg/dL  Phosphorus     Status: None   Collection Time: 11/01/16  5:09 AM  Result Value Ref Range   Phosphorus 3.3 2.5 - 4.6 mg/dL  CBC with Differential/Platelet     Status: None   Collection Time: 11/01/16  5:09 AM  Result Value Ref Range   WBC 6.6 4.0 - 10.5 K/uL   RBC 4.53 3.87 - 5.11 MIL/uL   Hemoglobin 13.6 12.0 - 15.0 g/dL   HCT 39.5 36.0 - 46.0 %   MCV 87.2 78.0 - 100.0 fL   MCH 30.0 26.0 - 34.0 pg   MCHC 34.4 30.0 - 36.0 g/dL   RDW 12.2 11.5 - 15.5 %   Platelets 283 150 - 400 K/uL   Neutrophils Relative % 47 %   Neutro Abs 3.1 1.7 - 7.7 K/uL   Lymphocytes Relative 39 %   Lymphs Abs 2.6 0.7 - 4.0 K/uL   Monocytes Relative 12 %   Monocytes Absolute 0.8 0.1 - 1.0 K/uL   Eosinophils Relative 2 %   Eosinophils Absolute 0.2 0.0 - 0.7 K/uL   Basophils Relative 0 %   Basophils Absolute 0.0 0.0 - 0.1 K/uL     Lipid Panel     Component Value Date/Time   CHOL 110 02/14/2015 1609   TRIG 104.0 02/14/2015 1609   HDL 46.50 02/14/2015 1609   CHOLHDL 2 02/14/2015 1609   VLDL 20.8 02/14/2015 1609   LDLCALC 43 02/14/2015 1609     No results found for: HGBA1C      HPI :  20 year old female with past medical history of ADHD. Based on clinic notes that describe conversation with PCP and mother, Miss Sandra Ali demonstrates a series of erratic behaviors such as frequently moving out of her house to live with her friends and then returning, anger issues, and other "risky behaviors". 11/22 early a.m. She was at a party where she was noticed by other party goers to be lethargic and minimally responsive. EMS was contacted and in route to the  emergency department she was administered Narcan and Zofran after which she promptly vomited. Upon presentation to the emergency department  she was altered and eventually progressed to the point in which she was unable to protect her airway requiring intubation. Alcohol level noted to be 499 in the emergency department. Other notable laboratory findings include potassium 3.1, glucose 196, WBC 16.6, hemoglobin 16.1, platelets 420. She was initially hypotensive in the emergency department which has improved after 4 L of normal saline. Also hypothermic with a rectal temperature 85F. PCCM asked to admit  HOSPITAL COURSE: *   Alcohol intoxication Patient intubated for airway protection, extubated 11/23 Currently on room air Psychiatry consulted for her ADHD and alcohol intoxication-patient denies any suicidal or homicidal ideation, currently no signs of alcohol withdrawal Patient demonstrates competency Cleared by psychiatry, at this time does not meet criteria for inpatient admission Social work consulted for alcohol dependence  Hypotension/dehydration/lactic acidosis Likely in the setting of alcohol use   Hypokalemia  Repleted, magnesium checked and within normal limits  Acute encephalopathy-resolved   Discharge Exam:   Blood pressure 115/72, pulse 68, temperature 97.7 F (36.5 C), temperature source Oral, resp. rate 18, weight 67 kg (147 lb 11.3 oz), last menstrual period 10/25/2016, SpO2 99 %. General:  Young female of normal body habitus  Neuro:  Awake alert appropriate  HEENT:  [place]/AT, pupils dilated, No JVD Cardiovascular:  RRR, no MRG Lungs:  resps even non labored on RA, few scattered rhonchi R>L Abdomen:  Soft, non-distended Musculoskeletal:  No acute deformity or edema Skin:  Grossly intact     Follow-up Information    Lottie Dawson, MD. Call in 2 day(s).   Specialties:  Internal Medicine, Pediatrics Why:  hospital follow up Contact information: [address] [place] [address] [phone]           Signed: Reyne Dumas 11/02/2016, 7:50 AM        Time spent >45 mins

## 2016-11-02 NOTE — Progress Notes (Signed)
Pt discharged to home.  Discharge instructions explained to pt.  Pt has no questions at the time of discharge.  Pt states she has all belongings.  IV removed.  Pt ambulated of unit on her own.

## 2016-11-05 ENCOUNTER — Telehealth: Payer: Self-pay

## 2016-11-05 LAB — CULTURE, BLOOD (ROUTINE X 2)
CULTURE: NO GROWTH
CULTURE: NO GROWTH

## 2016-11-05 NOTE — Telephone Encounter (Signed)
LMTCB

## 2016-11-09 ENCOUNTER — Ambulatory Visit (INDEPENDENT_AMBULATORY_CARE_PROVIDER_SITE_OTHER): Payer: 59 | Admitting: Internal Medicine

## 2016-11-09 ENCOUNTER — Encounter: Payer: Self-pay | Admitting: Internal Medicine

## 2016-11-09 VITALS — BP 116/62 | Temp 98.2°F | Wt 144.0 lb

## 2016-11-09 DIAGNOSIS — F1010 Alcohol abuse, uncomplicated: Secondary | ICD-10-CM

## 2016-11-09 DIAGNOSIS — Z79899 Other long term (current) drug therapy: Secondary | ICD-10-CM

## 2016-11-09 DIAGNOSIS — F17200 Nicotine dependence, unspecified, uncomplicated: Secondary | ICD-10-CM

## 2016-11-09 LAB — CBC WITH DIFFERENTIAL/PLATELET
Basophils Absolute: 0 10*3/uL (ref 0.0–0.1)
Basophils Relative: 0.5 % (ref 0.0–3.0)
EOS ABS: 0.2 10*3/uL (ref 0.0–0.7)
EOS PCT: 3.5 % (ref 0.0–5.0)
HCT: 42.7 % (ref 36.0–49.0)
HEMOGLOBIN: 14.6 g/dL (ref 12.0–16.0)
LYMPHS PCT: 34.7 % (ref 24.0–48.0)
Lymphs Abs: 2.5 10*3/uL (ref 0.7–4.0)
MCHC: 34.1 g/dL (ref 31.0–37.0)
MCV: 87.7 fl (ref 78.0–98.0)
MONOS PCT: 8.8 % (ref 3.0–12.0)
Monocytes Absolute: 0.6 10*3/uL (ref 0.1–1.0)
NEUTROS ABS: 3.8 10*3/uL (ref 1.4–7.7)
Neutrophils Relative %: 52.5 % (ref 43.0–71.0)
PLATELETS: 394 10*3/uL (ref 150.0–575.0)
RBC: 4.87 Mil/uL (ref 3.80–5.70)
RDW: 12.1 % (ref 11.4–15.5)
WBC: 7.2 10*3/uL (ref 4.5–13.5)

## 2016-11-09 LAB — COMPREHENSIVE METABOLIC PANEL
ALBUMIN: 4.4 g/dL (ref 3.5–5.2)
ALT: 36 U/L — ABNORMAL HIGH (ref 0–35)
AST: 16 U/L (ref 0–37)
Alkaline Phosphatase: 95 U/L (ref 47–119)
BUN: 14 mg/dL (ref 6–23)
CALCIUM: 9.4 mg/dL (ref 8.4–10.5)
CHLORIDE: 105 meq/L (ref 96–112)
CO2: 28 meq/L (ref 19–32)
CREATININE: 0.68 mg/dL (ref 0.40–1.20)
GFR: 117.31 mL/min (ref >60.00)
Glucose, Bld: 99 mg/dL (ref 70–99)
POTASSIUM: 4 meq/L (ref 3.5–5.1)
Sodium: 142 mEq/L (ref 135–145)
Total Bilirubin: 0.5 mg/dL (ref 0.2–1.2)
Total Protein: 6.7 g/dL (ref 6.0–8.3)

## 2016-11-09 NOTE — Progress Notes (Signed)
Pre visit review using our clinic review tool, if applicable. No additional management support is needed unless otherwise documented below in the visit note. 

## 2016-11-09 NOTE — Telephone Encounter (Signed)
Pt did not return TCM call. Pt seen today in office. Nothing further needed.

## 2016-11-09 NOTE — Progress Notes (Signed)
Chief Complaint  Patient presents with  . Hospitalization Follow-up    HPI: Sandra Ali 20 y.o. comes in today for follow-up of hospitalization for acute alcohol intoxication with respiratory affects mental status changes and temporary intubation. Alcohol level was in the 400s. uds neg Mom is here with her today. She states that she drank that much alcohol because she had a fight with her mom was upset and "wanted to die". She admits to being a "alcoholic" drinking most days over the last 2-3 years but has been off for 1-2 weeks at a time. Denies other RD but did use marijuana at some point. She did have a quick psychiatry consult when in hospital was not felt to be needed for hospitalization mom found. To be able to sign up for [facility] for 6-8 week outpatient program and her counselor office has moved across the street from the [facility] and mom has arranged her to continue the counseling.  She states she is taking the patch for nicotine withdrawal doesn't remember she was taking they try not the day she took the alcohol   Admit date: 10/31/2016 Discharge date: 11/02/2016  Discharge Diagnoses:    Active Problems:   Altered mental status   Respiratory failure (HCC)   Alcohol intoxication (HCC)   Follow-up recommendations Follow-up with PCP in 3-5 days , including all  additional recommended appointments as below Follow-up CBC, CMP in 3-5 days          Current Discharge Medication List       START taking these medications   Details  nicotine (NICODERM CQ - DOSED IN MG/24 HOURS14 mg/24hr patch Place 1 patch (14 mg total) onto the skin daily. Qty: 28 patch, Refills: 0         CONTINUE these medications which have NOT CHANGED   Details  escitalopram (LEXAPRO) 10 MG tablet TAKE 1 AND 1/2 TABLET EVERY DAY Qty: 45 tablet, Refills: 6    methylphenidate (DAYTRANA) 15 mg/9hr Place 1 patch (15 mg total) onto the skin daily. wear patch for 9  hours only each day Qty: 90 patch, Refills: 0         ROS: See pertinent positives and negatives per HPI.  Past Medical History:  Diagnosis Date  . ADHD 09/29/2007  . INSOMNIA UNSPECIFIED 02/24/2008  . SCOLIOSIS, MILD 03/28/2009  . SORE THROAT 11/24/2007  . UNS ADVRS EFF OTH RX MEDICINAL\T\BIOLOGICAL SBSTNC 04/30/2008    Family History  Problem Relation Age of Onset  . Adopted: Yes    Social History   Social History  . Marital status: Single    Spouse name: N/A  . Number of children: N/A  . Years of education: N/A   Social History Main Topics  . Smoking status: Current Every Day Smoker  . Smokeless tobacco: Never Used  . Alcohol use No  . Drug use: No  . Sexual activity: Not Asked   Other Topics Concern  . None   Social History Narrative   HH of 3   No tobacco   Adopted from  [place]   [employer] in 8th grade   Now [employer] program   Just got a job at  [employer].    Outpatient Medications Prior to Visit  Medication Sig Dispense Refill  . escitalopram (LEXAPRO) 10 MG tablet TAKE 1 AND 1/2 TABLET EVERY DAY (Patient taking differently: Take 15 mg by mouth daily. ) 45 tablet 6  . methylphenidate (DAYTRANA) 15 mg/9hr  Place 1 patch (15 mg total) onto the skin daily. wear patch for 9 hours only each day 90 patch 0  . nicotine (NICODERM CQ - DOSED IN MG/24 HOURS) 14 mg/24hr patch Place 1 patch (14 mg total) onto the skin daily. 28 patch 0   No facility-administered medications prior to visit.      EXAM:  BP 116/62 (BP Location: Right Arm, Patient Position: Sitting, Cuff Size: Normal)   Temp 98.2 F (36.8 C) (Oral)   Wt 144 lb (65.3 kg)   LMP 10/25/2016 (Within Days)   BMI 24.72 kg/m   Body mass index is 24.72 kg/m. Exam alone interview withmom and  With [place]jo alone GENERAL: vitals reviewed and listed above, alert, oriented, appears well hydrated and in no acute distress HEENT: atraumatic, conjunctiva  clear, no obvious abnormalities  on inspection of external nose and ears OP : no lesion edema or exudate  NECK: no obvious masses on inspection palpation  LUNGS: clear to auscultation bilaterally, no wheezes, rales or rhonchi,  CV: HRRR, no clubbing cyanosis or  peripheral edema nl cap refill  MS: moves all extremities without noticeable focal  abnormality PSYCH: pleasant and cooperative,      Lab Results  Component Value Date   WBC 7.2 11/09/2016   HGB 14.6 11/09/2016   HCT 42.7 11/09/2016   PLT 394.0 11/09/2016   GLUCOSE 99 11/09/2016   CHOL 110 02/14/2015   TRIG 104.0 02/14/2015   HDL 46.50 02/14/2015   LDLCALC 43 02/14/2015   ALT 36 (H) 11/09/2016   AST 16 11/09/2016   NA 142 11/09/2016   K 4.0 11/09/2016   CL 105 11/09/2016   CREATININE 0.68 11/09/2016   BUN 14 11/09/2016   CO2 28 11/09/2016   TSH 0.91 02/14/2015   Wt Readings from Last 3 Encounters:  11/09/16 144 lb (65.3 kg) (74 %, Z= 0.64)*  11/01/16 147 lb 11.3 oz (67 kg) (78 %, Z= 0.76)*  09/27/16 147 lb 3.2 oz (66.8 kg) (77 %, Z= 0.75)*   * Growth percentiles are based on CDC 2-20 Years data.   BP Readings from Last 3 Encounters:  11/09/16 116/62  11/02/16 115/72  09/27/16 114/68    ASSESSMENT AND PLAN:  Discussed the following assessment and plan:  Alcohol abuse - Plan: CMP, CBC with Differential/Platelet  Medication management - Plan: CMP, CBC with Differential/Platelet  Tobacco dependence - Plan: CMP, CBC with Differential/Platelet  -Patient advised to return or notify health care team  if symptoms worsen ,persist or new concerns arise. Plan rov depending o n labs etc     Cont in 8 week program     Patient Instructions  Will notify you  of labs when available.  Agree with   Program  Alcohol  .   And  Continued contact with counselor .  Plan follow up depending on results.    Neta MendsWanda K. Elenor Wildes M.D.

## 2016-11-09 NOTE — Patient Instructions (Addendum)
Will notify you  of labs when available.  Agree with   Program  Alcohol  .   And  Continued contact with counselor .  Plan follow up depending on results.

## 2016-12-11 ENCOUNTER — Encounter (HOSPITAL_COMMUNITY): Payer: Self-pay

## 2016-12-11 ENCOUNTER — Emergency Department (HOSPITAL_COMMUNITY)
Admission: EM | Admit: 2016-12-11 | Discharge: 2016-12-11 | Disposition: A | Discharge status: Home / Self Care | Payer: 59 | Attending: Emergency Medicine | Admitting: Emergency Medicine

## 2016-12-11 DIAGNOSIS — F10120 Alcohol abuse with intoxication, uncomplicated: Secondary | ICD-10-CM | POA: Insufficient documentation

## 2016-12-11 DIAGNOSIS — F17210 Nicotine dependence, cigarettes, uncomplicated: Secondary | ICD-10-CM | POA: Insufficient documentation

## 2016-12-11 DIAGNOSIS — Z79899 Other long term (current) drug therapy: Secondary | ICD-10-CM | POA: Diagnosis not present

## 2016-12-11 DIAGNOSIS — F909 Attention-deficit hyperactivity disorder, unspecified type: Secondary | ICD-10-CM | POA: Diagnosis not present

## 2016-12-11 DIAGNOSIS — Y906 Blood alcohol level of 120-199 mg/100 ml: Secondary | ICD-10-CM | POA: Diagnosis not present

## 2016-12-11 DIAGNOSIS — F10129 Alcohol abuse with intoxication, unspecified: Secondary | ICD-10-CM | POA: Diagnosis present

## 2016-12-11 DIAGNOSIS — F10920 Alcohol use, unspecified with intoxication, uncomplicated: Secondary | ICD-10-CM

## 2016-12-11 LAB — CBC
HCT: 42.5 % (ref 36.0–46.0)
HEMOGLOBIN: 15.2 g/dL — AB (ref 12.0–15.0)
MCH: 29.5 pg (ref 26.0–34.0)
MCHC: 35.8 g/dL (ref 30.0–36.0)
MCV: 82.4 fL (ref 78.0–100.0)
Platelets: 435 10*3/uL — ABNORMAL HIGH (ref 150–400)
RBC: 5.16 MIL/uL — AB (ref 3.87–5.11)
RDW: 11.8 % (ref 11.5–15.5)
WBC: 7.9 10*3/uL (ref 4.0–10.5)

## 2016-12-11 LAB — RAPID URINE DRUG SCREEN, HOSP PERFORMED
AMPHETAMINES: NOT DETECTED
BARBITURATES: NOT DETECTED
Benzodiazepines: NOT DETECTED
COCAINE: NOT DETECTED
OPIATES: NOT DETECTED
Tetrahydrocannabinol: NOT DETECTED

## 2016-12-11 LAB — COMPREHENSIVE METABOLIC PANEL
ALT: 16 U/L (ref 14–54)
AST: 19 U/L (ref 15–41)
Albumin: 4.5 g/dL (ref 3.5–5.0)
Alkaline Phosphatase: 97 U/L (ref 38–126)
Anion gap: 9 (ref 5–15)
BUN: 9 mg/dL (ref 6–20)
CHLORIDE: 110 mmol/L (ref 101–111)
CO2: 26 mmol/L (ref 22–32)
CREATININE: 0.71 mg/dL (ref 0.44–1.00)
Calcium: 9.2 mg/dL (ref 8.9–10.3)
GFR calc Af Amer: >60 mL/min (ref >60)
Glucose, Bld: 122 mg/dL — ABNORMAL HIGH (ref 65–99)
Potassium: 3.5 mmol/L (ref 3.5–5.1)
Sodium: 145 mmol/L (ref 135–145)
Total Bilirubin: 0.4 mg/dL (ref 0.3–1.2)
Total Protein: 7 g/dL (ref 6.5–8.1)

## 2016-12-11 LAB — ETHANOL: ALCOHOL ETHYL (B): 186 mg/dL — AB (ref <5)

## 2016-12-11 MED ORDER — ACETAMINOPHEN 325 MG PO TABS
650.0000 mg | ORAL_TABLET | Freq: Once | ORAL | Status: AC
Start: 2016-12-11 — End: 2016-12-11
  Administered 2016-12-11: 650 mg via ORAL
  Filled 2016-12-11: qty 2
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
## 2016-12-11 NOTE — ED Notes (Signed)
Patient eating breakfast, calm and cooperative.

## 2017-01-31 ENCOUNTER — Other Ambulatory Visit: Payer: Self-pay | Admitting: Family Medicine

## 2017-02-06 MED ORDER — NICOTINE 14 MG/24HR TD PT24: 14.0000 mg | MEDICATED_PATCH | Freq: Every day | TRANSDERMAL | 0 refills | Status: DC

## 2017-02-06 MED ORDER — NICOTINE 21 MG/24HR TD PT24: 21.0000 mg | MEDICATED_PATCH | Freq: Every day | TRANSDERMAL | 0 refills | Status: DC

## 2017-02-25 ENCOUNTER — Ambulatory Visit (INDEPENDENT_AMBULATORY_CARE_PROVIDER_SITE_OTHER): Payer: 59 | Admitting: Internal Medicine

## 2017-02-25 ENCOUNTER — Encounter: Payer: Self-pay | Admitting: Internal Medicine

## 2017-02-25 VITALS — BP 110/70 | HR 100 | Temp 98.1°F | Ht 65.0 in | Wt 148.6 lb

## 2017-02-25 DIAGNOSIS — F1021 Alcohol dependence, in remission: Secondary | ICD-10-CM | POA: Diagnosis not present

## 2017-02-25 DIAGNOSIS — F17200 Nicotine dependence, unspecified, uncomplicated: Secondary | ICD-10-CM | POA: Diagnosis not present

## 2017-02-25 DIAGNOSIS — F909 Attention-deficit hyperactivity disorder, unspecified type: Secondary | ICD-10-CM

## 2017-02-25 DIAGNOSIS — Z79899 Other long term (current) drug therapy: Secondary | ICD-10-CM | POA: Diagnosis not present

## 2017-02-25 DIAGNOSIS — F4322 Adjustment disorder with anxiety: Secondary | ICD-10-CM | POA: Diagnosis not present

## 2017-02-25 MED ORDER — NALTREXONE HCL 50 MG PO TABS: 50.0000 mg | ORAL_TABLET | Freq: Every day | ORAL | 0 refills | Status: DC

## 2017-02-25 MED ORDER — ESCITALOPRAM OXALATE 20 MG PO TABS: 20.0000 mg | ORAL_TABLET | Freq: Every day | ORAL | 1 refills | Status: DC

## 2017-02-25 MED ORDER — ATOMOXETINE HCL 40 MG PO CAPS: 40.0000 mg | ORAL_CAPSULE | Freq: Every day | ORAL | 0 refills | Status: AC
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
## 2017-02-25 NOTE — Patient Instructions (Addendum)
Glad you are doing ok.     Will refill medication  In the interim  But   I advise  Getting a behavioral health prescriber. for the naltrexone  Etc in the future   Refills   For reasons explained.   Plan ROV in 2 months  Or as needed .  Advise  Set  Up with OP psychiatry or provider  For future med management . Also  Contact us if need refills in interim  Patch etc.

## 2017-04-01 ENCOUNTER — Other Ambulatory Visit: Payer: Self-pay | Admitting: Internal Medicine

## 2017-04-01 ENCOUNTER — Other Ambulatory Visit: Payer: Self-pay | Admitting: Emergency Medicine

## 2017-04-01 ENCOUNTER — Telehealth: Payer: Self-pay | Admitting: Emergency Medicine

## 2017-04-01 MED ORDER — ESCITALOPRAM OXALATE 20 MG PO TABS: 20.0000 mg | ORAL_TABLET | Freq: Every day | ORAL | 1 refills | Status: DC

## 2017-04-01 NOTE — Telephone Encounter (Signed)
Pharmacy states that did not receive the script sent out on 02/25/2017. Resending script.
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
## 2017-06-18 ENCOUNTER — Encounter: Payer: Self-pay | Admitting: Internal Medicine

## 2017-06-18 ENCOUNTER — Ambulatory Visit (INDEPENDENT_AMBULATORY_CARE_PROVIDER_SITE_OTHER): Payer: 59 | Admitting: Internal Medicine

## 2017-06-18 VITALS — BP 110/80 | HR 86 | Temp 98.7°F | Wt 134.8 lb

## 2017-06-18 DIAGNOSIS — Z79899 Other long term (current) drug therapy: Secondary | ICD-10-CM

## 2017-06-18 DIAGNOSIS — Z113 Encounter for screening for infections with a predominantly sexual mode of transmission: Secondary | ICD-10-CM | POA: Diagnosis not present

## 2017-06-18 DIAGNOSIS — F909 Attention-deficit hyperactivity disorder, unspecified type: Secondary | ICD-10-CM | POA: Diagnosis not present

## 2017-06-18 DIAGNOSIS — F1021 Alcohol dependence, in remission: Secondary | ICD-10-CM | POA: Diagnosis not present

## 2017-06-18 DIAGNOSIS — F4322 Adjustment disorder with anxiety: Secondary | ICD-10-CM

## 2017-06-18 MED ORDER — ESCITALOPRAM OXALATE 20 MG PO TABS: 20.0000 mg | ORAL_TABLET | Freq: Every day | ORAL | 1 refills | Status: DC

## 2017-06-18 MED ORDER — NALTREXONE HCL 50 MG PO TABS: 50.0000 mg | ORAL_TABLET | Freq: Every day | ORAL | 1 refills | Status: AC

## 2017-06-18 NOTE — Patient Instructions (Signed)
Will notify you  of labs when available.  Keep working with counseling and support .   rov in 6 months or   Can transition over to  [facility]  [facility]  if needed

## 2017-06-19 LAB — GC/CHLAMYDIA PROBE AMP
CT PROBE, AMP APTIMA: NOT DETECTED
GC PROBE AMP APTIMA: NOT DETECTED

## 2017-06-19 LAB — HIV ANTIBODY (ROUTINE TESTING W REFLEX): HIV: NONREACTIVE

## 2017-06-19 LAB — RPR

## 2018-01-30 IMAGING — CT CT CERVICAL SPINE W/O CM
1 of 3 series · 4 of 33 positions shown · non-contrast
Comparison: None.

CLINICAL DATA: Altered mental status

EXAM:
CT HEAD WITHOUT CONTRAST
CT CERVICAL SPINE WITHOUT CONTRAST
TECHNIQUE: Multidetector CT imaging of the head and cervical spine was
performed following the standard protocol without intravenous
contrast. Multiplanar CT image reconstructions of the cervical spine
were also generated.

[Series 308: cor · coronal · 0.26mm/px · 4 of 36 slices shown]
[im 8/36  bone]
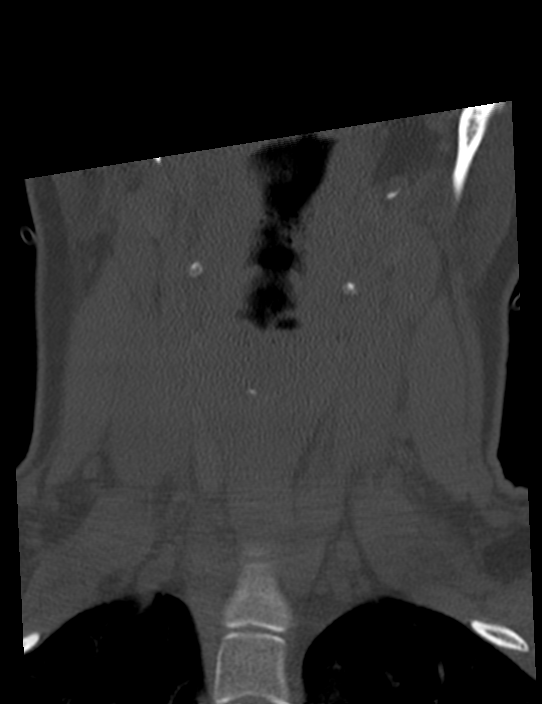
[im 15/36  bone]
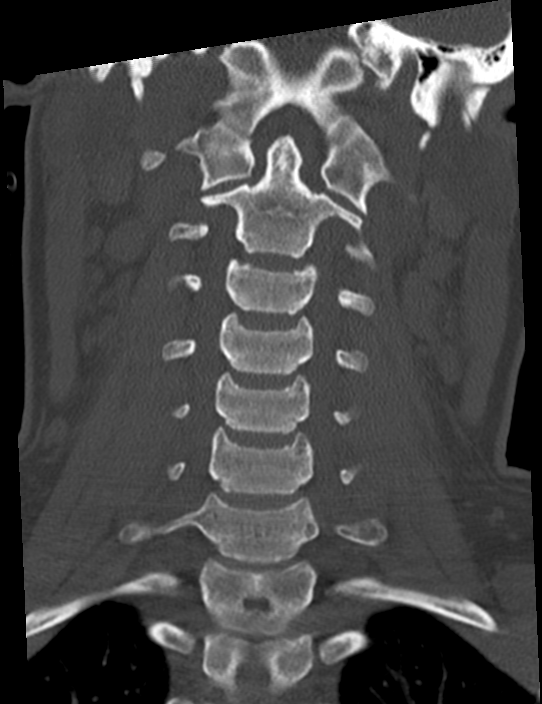
[im 22/36  bone]
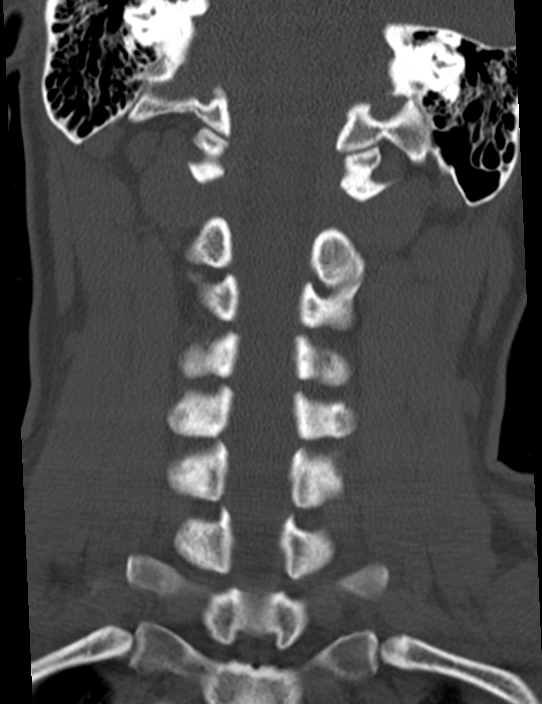
[im 29/36  bone]
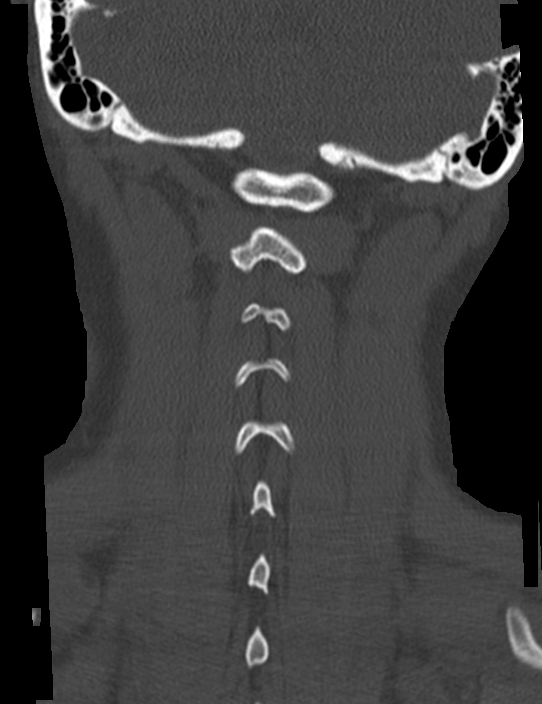

[4 of 33 positions shown; findings below may reference images not displayed]

FINDINGS: CT HEAD FINDINGS

Brain: No evidence of acute infarction, hemorrhage, hydrocephalus,
extra-axial collection or mass lesion/mass effect.

Vascular: No hyperdense vessel or unexpected calcification.

Skull: Normal. Negative for fracture or focal lesion.

Sinuses/Orbits: No acute finding.

Other: None.

CT CERVICAL SPINE FINDINGS

Alignment: Reversal of the usual cervical lordosis without anterior
subluxation. This may be due to patient positioning but ligamentous
injury or muscle spasm could also have this appearance and are not
excluded. C1-2 articulation appears intact.

Skull base and vertebrae: No acute fracture. No primary bone lesion
or focal pathologic process.

Soft tissues and spinal canal: No prevertebral fluid or swelling. No
visible canal hematoma.

Disc levels: Disc space heights are preserved. No significant
degenerative changes.

Upper chest: Negative.

Other: None.
IMPRESSION: No acute intracranial abnormalities.

Nonspecific reversal of the usual cervical lordosis. No acute
displaced fractures identified.

## 2018-02-01 IMAGING — DX DG CHEST 2V
2 series · 2 of 2 positions shown · non-contrast
Comparison: 10/31/2016 radiographs.

CLINICAL DATA: Shortness of breath. Possible aspiration. Current
smoker.

EXAM:
CHEST  2 VIEW

[chest pa]
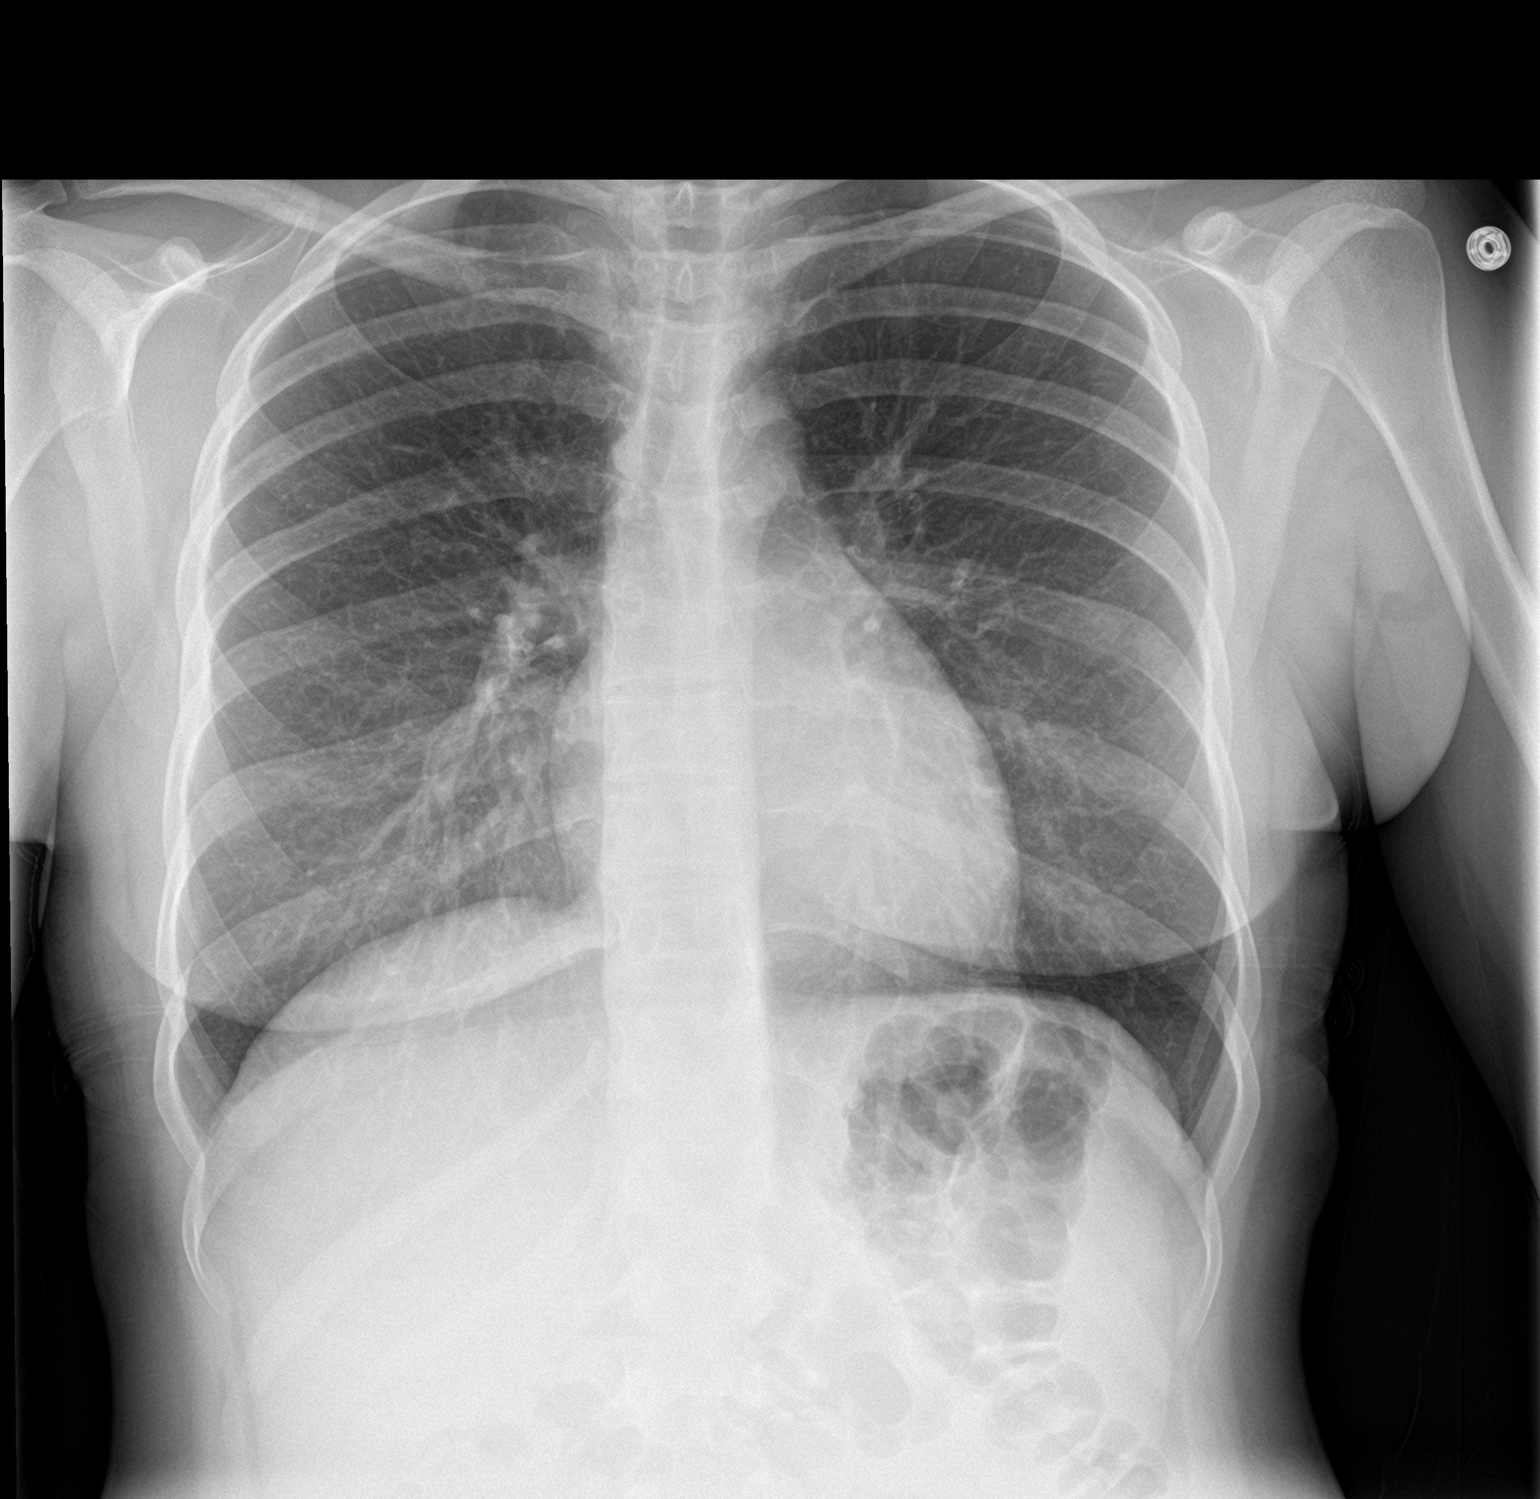

[chest lat]
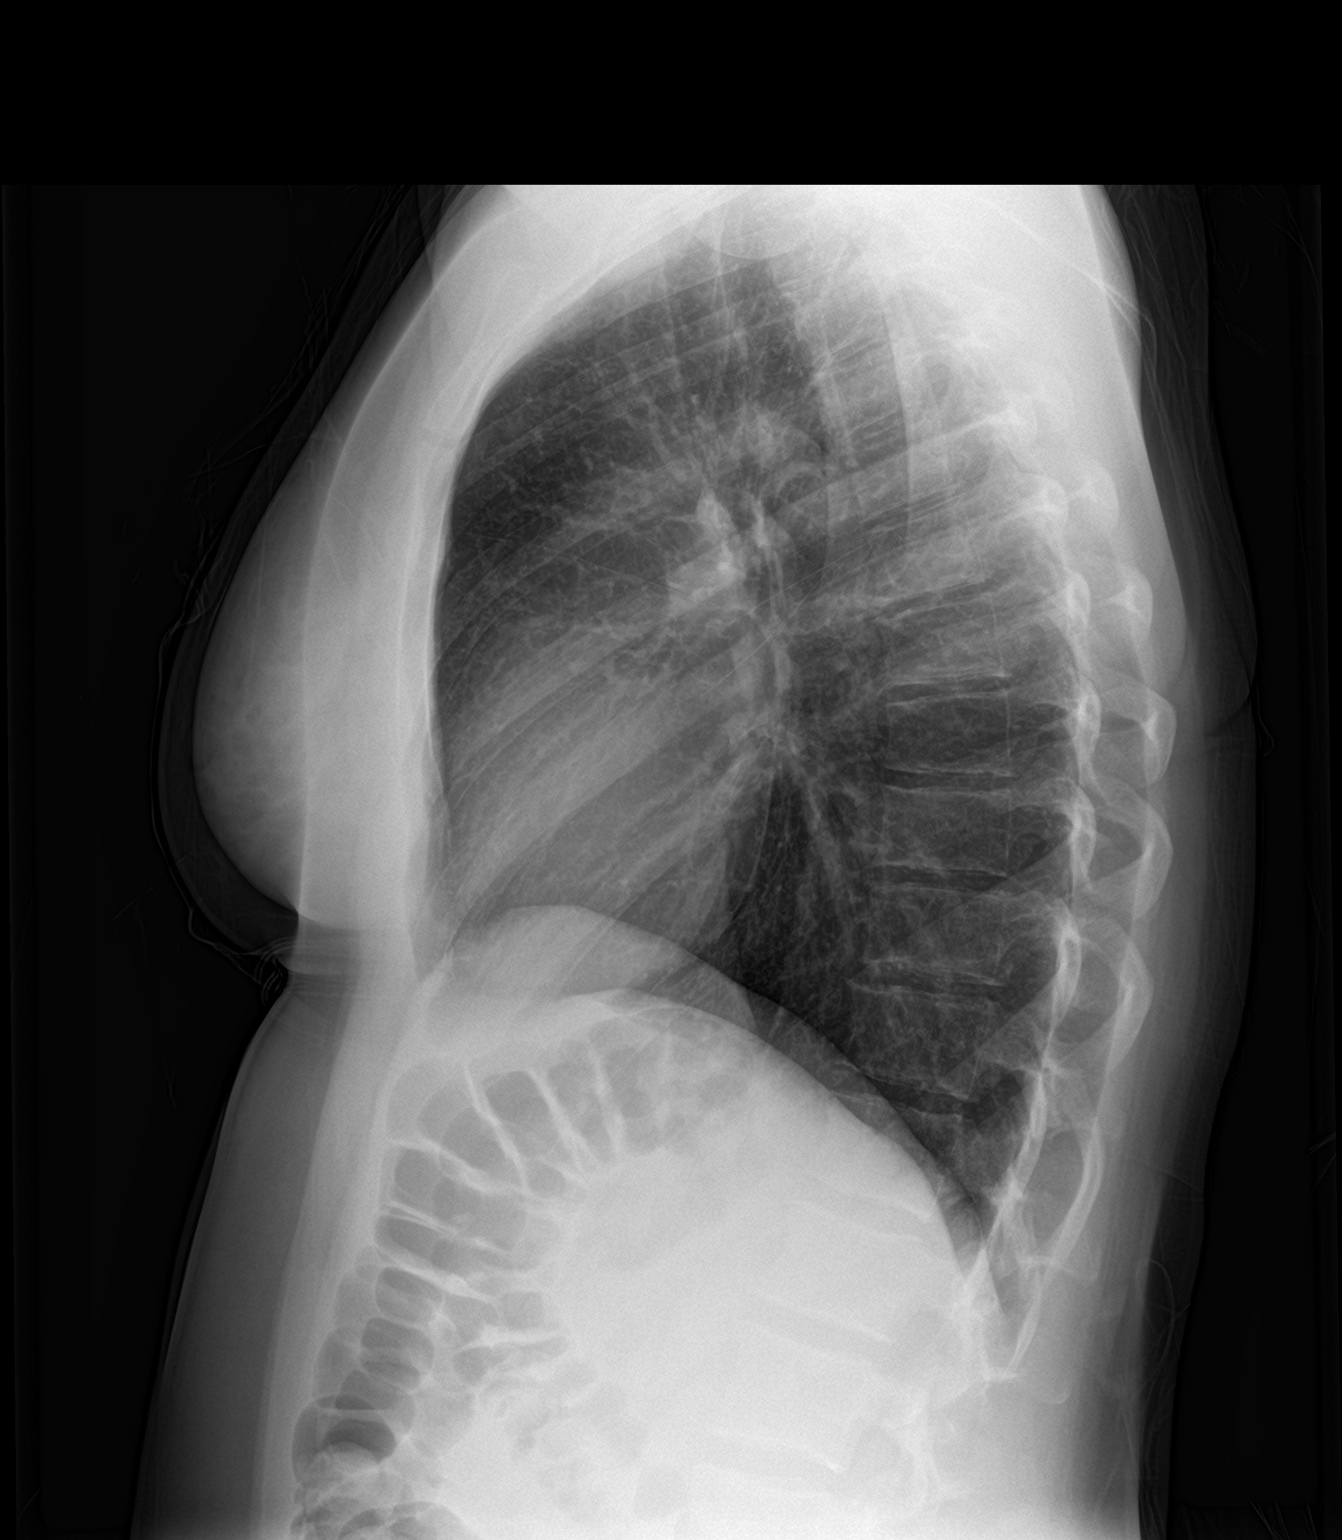

[2 of 2 positions shown; findings below may reference images not displayed]

FINDINGS: Interval extubation. The heart size and mediastinal contours are
normal. The lungs are clear. There is no pleural effusion or
pneumothorax. No acute osseous findings are identified.
IMPRESSION: Interval clearing of the lungs post extubation. No active
cardiopulmonary process.

## 2018-08-06 DIAGNOSIS — M5412 Radiculopathy, cervical region: Secondary | ICD-10-CM | POA: Diagnosis not present

## 2018-08-06 DIAGNOSIS — R293 Abnormal posture: Secondary | ICD-10-CM | POA: Diagnosis not present

## 2018-08-06 DIAGNOSIS — M2560 Stiffness of unspecified joint, not elsewhere classified: Secondary | ICD-10-CM | POA: Diagnosis not present

## 2018-08-08 DIAGNOSIS — M5412 Radiculopathy, cervical region: Secondary | ICD-10-CM | POA: Diagnosis not present

## 2018-08-08 DIAGNOSIS — M2560 Stiffness of unspecified joint, not elsewhere classified: Secondary | ICD-10-CM | POA: Diagnosis not present

## 2018-08-08 DIAGNOSIS — R293 Abnormal posture: Secondary | ICD-10-CM | POA: Diagnosis not present

## 2018-08-15 DIAGNOSIS — R293 Abnormal posture: Secondary | ICD-10-CM | POA: Diagnosis not present

## 2018-08-15 DIAGNOSIS — M5412 Radiculopathy, cervical region: Secondary | ICD-10-CM | POA: Diagnosis not present

## 2018-08-15 DIAGNOSIS — M2560 Stiffness of unspecified joint, not elsewhere classified: Secondary | ICD-10-CM | POA: Diagnosis not present

## 2018-08-18 DIAGNOSIS — R293 Abnormal posture: Secondary | ICD-10-CM | POA: Diagnosis not present

## 2018-08-18 DIAGNOSIS — M2560 Stiffness of unspecified joint, not elsewhere classified: Secondary | ICD-10-CM | POA: Diagnosis not present

## 2018-08-18 DIAGNOSIS — M5412 Radiculopathy, cervical region: Secondary | ICD-10-CM | POA: Diagnosis not present

## 2018-08-25 DIAGNOSIS — R293 Abnormal posture: Secondary | ICD-10-CM | POA: Diagnosis not present

## 2018-08-25 DIAGNOSIS — M5412 Radiculopathy, cervical region: Secondary | ICD-10-CM | POA: Diagnosis not present

## 2018-08-25 DIAGNOSIS — M2560 Stiffness of unspecified joint, not elsewhere classified: Secondary | ICD-10-CM | POA: Diagnosis not present

## 2018-08-27 ENCOUNTER — Other Ambulatory Visit: Payer: Self-pay | Admitting: Internal Medicine

## 2018-09-01 DIAGNOSIS — M2560 Stiffness of unspecified joint, not elsewhere classified: Secondary | ICD-10-CM | POA: Diagnosis not present

## 2018-09-01 DIAGNOSIS — R293 Abnormal posture: Secondary | ICD-10-CM | POA: Diagnosis not present

## 2018-09-01 DIAGNOSIS — M5412 Radiculopathy, cervical region: Secondary | ICD-10-CM | POA: Diagnosis not present

## 2018-09-02 NOTE — Telephone Encounter (Signed)
Last filled 06/18/17 #90 x 1 refill Pt was due back for 41mo fu Jan 2019, no appt scheduled.  Note added to Rx for follow up appt needed.

## 2018-09-08 DIAGNOSIS — M2560 Stiffness of unspecified joint, not elsewhere classified: Secondary | ICD-10-CM | POA: Diagnosis not present

## 2018-09-08 DIAGNOSIS — R293 Abnormal posture: Secondary | ICD-10-CM | POA: Diagnosis not present

## 2018-09-08 DIAGNOSIS — M5412 Radiculopathy, cervical region: Secondary | ICD-10-CM | POA: Diagnosis not present

## 2018-09-15 DIAGNOSIS — R293 Abnormal posture: Secondary | ICD-10-CM | POA: Diagnosis not present

## 2018-09-15 DIAGNOSIS — M2560 Stiffness of unspecified joint, not elsewhere classified: Secondary | ICD-10-CM | POA: Diagnosis not present

## 2018-09-15 DIAGNOSIS — M5412 Radiculopathy, cervical region: Secondary | ICD-10-CM | POA: Diagnosis not present

## 2018-09-22 DIAGNOSIS — M5412 Radiculopathy, cervical region: Secondary | ICD-10-CM | POA: Diagnosis not present

## 2018-09-22 DIAGNOSIS — R293 Abnormal posture: Secondary | ICD-10-CM | POA: Diagnosis not present

## 2018-09-22 DIAGNOSIS — M2560 Stiffness of unspecified joint, not elsewhere classified: Secondary | ICD-10-CM | POA: Diagnosis not present

## 2018-09-26 DIAGNOSIS — R293 Abnormal posture: Secondary | ICD-10-CM | POA: Diagnosis not present

## 2018-09-26 DIAGNOSIS — M5412 Radiculopathy, cervical region: Secondary | ICD-10-CM | POA: Diagnosis not present

## 2018-09-26 DIAGNOSIS — M2560 Stiffness of unspecified joint, not elsewhere classified: Secondary | ICD-10-CM | POA: Diagnosis not present

## 2018-09-27 ENCOUNTER — Other Ambulatory Visit: Payer: Self-pay | Admitting: Internal Medicine

## 2018-10-03 DIAGNOSIS — M2560 Stiffness of unspecified joint, not elsewhere classified: Secondary | ICD-10-CM | POA: Diagnosis not present

## 2018-10-03 DIAGNOSIS — R293 Abnormal posture: Secondary | ICD-10-CM | POA: Diagnosis not present

## 2018-10-03 DIAGNOSIS — M5412 Radiculopathy, cervical region: Secondary | ICD-10-CM | POA: Diagnosis not present

## 2018-10-08 DIAGNOSIS — M2560 Stiffness of unspecified joint, not elsewhere classified: Secondary | ICD-10-CM | POA: Diagnosis not present

## 2018-10-08 DIAGNOSIS — M5412 Radiculopathy, cervical region: Secondary | ICD-10-CM | POA: Diagnosis not present

## 2018-10-08 DIAGNOSIS — R293 Abnormal posture: Secondary | ICD-10-CM | POA: Diagnosis not present

## 2018-10-15 DIAGNOSIS — R293 Abnormal posture: Secondary | ICD-10-CM | POA: Diagnosis not present

## 2018-10-15 DIAGNOSIS — M2560 Stiffness of unspecified joint, not elsewhere classified: Secondary | ICD-10-CM | POA: Diagnosis not present

## 2018-10-15 DIAGNOSIS — M5412 Radiculopathy, cervical region: Secondary | ICD-10-CM | POA: Diagnosis not present

## 2018-10-22 DIAGNOSIS — M2560 Stiffness of unspecified joint, not elsewhere classified: Secondary | ICD-10-CM | POA: Diagnosis not present

## 2018-10-22 DIAGNOSIS — R293 Abnormal posture: Secondary | ICD-10-CM | POA: Diagnosis not present

## 2018-10-22 DIAGNOSIS — M5412 Radiculopathy, cervical region: Secondary | ICD-10-CM | POA: Diagnosis not present

## 2018-10-29 DIAGNOSIS — R293 Abnormal posture: Secondary | ICD-10-CM | POA: Diagnosis not present

## 2018-10-29 DIAGNOSIS — M2560 Stiffness of unspecified joint, not elsewhere classified: Secondary | ICD-10-CM | POA: Diagnosis not present

## 2018-10-29 DIAGNOSIS — M5412 Radiculopathy, cervical region: Secondary | ICD-10-CM | POA: Diagnosis not present

## 2018-11-05 DIAGNOSIS — R293 Abnormal posture: Secondary | ICD-10-CM | POA: Diagnosis not present

## 2018-11-05 DIAGNOSIS — M2560 Stiffness of unspecified joint, not elsewhere classified: Secondary | ICD-10-CM | POA: Diagnosis not present

## 2018-11-05 DIAGNOSIS — M5412 Radiculopathy, cervical region: Secondary | ICD-10-CM | POA: Diagnosis not present

## 2018-11-14 DIAGNOSIS — M5412 Radiculopathy, cervical region: Secondary | ICD-10-CM | POA: Diagnosis not present

## 2018-11-14 DIAGNOSIS — R293 Abnormal posture: Secondary | ICD-10-CM | POA: Diagnosis not present

## 2018-11-14 DIAGNOSIS — M2560 Stiffness of unspecified joint, not elsewhere classified: Secondary | ICD-10-CM | POA: Diagnosis not present

## 2018-11-21 DIAGNOSIS — M5412 Radiculopathy, cervical region: Secondary | ICD-10-CM | POA: Diagnosis not present

## 2018-11-21 DIAGNOSIS — R293 Abnormal posture: Secondary | ICD-10-CM | POA: Diagnosis not present

## 2018-11-21 DIAGNOSIS — M2560 Stiffness of unspecified joint, not elsewhere classified: Secondary | ICD-10-CM | POA: Diagnosis not present

## 2018-12-05 DIAGNOSIS — R293 Abnormal posture: Secondary | ICD-10-CM | POA: Diagnosis not present

## 2018-12-05 DIAGNOSIS — M5412 Radiculopathy, cervical region: Secondary | ICD-10-CM | POA: Diagnosis not present

## 2018-12-05 DIAGNOSIS — M2560 Stiffness of unspecified joint, not elsewhere classified: Secondary | ICD-10-CM | POA: Diagnosis not present

## 2018-12-15 DIAGNOSIS — R293 Abnormal posture: Secondary | ICD-10-CM | POA: Diagnosis not present

## 2018-12-15 DIAGNOSIS — M5412 Radiculopathy, cervical region: Secondary | ICD-10-CM | POA: Diagnosis not present

## 2018-12-15 DIAGNOSIS — M2560 Stiffness of unspecified joint, not elsewhere classified: Secondary | ICD-10-CM | POA: Diagnosis not present

## 2018-12-22 DIAGNOSIS — M2560 Stiffness of unspecified joint, not elsewhere classified: Secondary | ICD-10-CM | POA: Diagnosis not present

## 2018-12-22 DIAGNOSIS — R293 Abnormal posture: Secondary | ICD-10-CM | POA: Diagnosis not present

## 2018-12-22 DIAGNOSIS — M5412 Radiculopathy, cervical region: Secondary | ICD-10-CM | POA: Diagnosis not present

## 2019-01-02 DIAGNOSIS — M5412 Radiculopathy, cervical region: Secondary | ICD-10-CM | POA: Diagnosis not present

## 2019-01-02 DIAGNOSIS — R293 Abnormal posture: Secondary | ICD-10-CM | POA: Diagnosis not present

## 2019-01-02 DIAGNOSIS — M2560 Stiffness of unspecified joint, not elsewhere classified: Secondary | ICD-10-CM | POA: Diagnosis not present

## 2019-01-21 DIAGNOSIS — R293 Abnormal posture: Secondary | ICD-10-CM | POA: Diagnosis not present

## 2019-01-21 DIAGNOSIS — M2560 Stiffness of unspecified joint, not elsewhere classified: Secondary | ICD-10-CM | POA: Diagnosis not present

## 2019-01-21 DIAGNOSIS — M5412 Radiculopathy, cervical region: Secondary | ICD-10-CM | POA: Diagnosis not present

## 2019-02-04 DIAGNOSIS — R293 Abnormal posture: Secondary | ICD-10-CM | POA: Diagnosis not present

## 2019-02-04 DIAGNOSIS — M5412 Radiculopathy, cervical region: Secondary | ICD-10-CM | POA: Diagnosis not present

## 2019-02-04 DIAGNOSIS — M2560 Stiffness of unspecified joint, not elsewhere classified: Secondary | ICD-10-CM | POA: Diagnosis not present

## 2019-02-11 DIAGNOSIS — R293 Abnormal posture: Secondary | ICD-10-CM | POA: Diagnosis not present

## 2019-02-11 DIAGNOSIS — M2560 Stiffness of unspecified joint, not elsewhere classified: Secondary | ICD-10-CM | POA: Diagnosis not present

## 2019-02-11 DIAGNOSIS — M5412 Radiculopathy, cervical region: Secondary | ICD-10-CM | POA: Diagnosis not present

## 2019-02-25 DIAGNOSIS — R293 Abnormal posture: Secondary | ICD-10-CM | POA: Diagnosis not present

## 2019-02-25 DIAGNOSIS — M2560 Stiffness of unspecified joint, not elsewhere classified: Secondary | ICD-10-CM | POA: Diagnosis not present

## 2019-02-25 DIAGNOSIS — M5412 Radiculopathy, cervical region: Secondary | ICD-10-CM | POA: Diagnosis not present

## 2019-03-04 DIAGNOSIS — R293 Abnormal posture: Secondary | ICD-10-CM | POA: Diagnosis not present

## 2019-03-04 DIAGNOSIS — M5412 Radiculopathy, cervical region: Secondary | ICD-10-CM | POA: Diagnosis not present

## 2019-03-04 DIAGNOSIS — M2560 Stiffness of unspecified joint, not elsewhere classified: Secondary | ICD-10-CM | POA: Diagnosis not present

## 2019-03-11 DIAGNOSIS — M2560 Stiffness of unspecified joint, not elsewhere classified: Secondary | ICD-10-CM | POA: Diagnosis not present

## 2019-03-11 DIAGNOSIS — R293 Abnormal posture: Secondary | ICD-10-CM | POA: Diagnosis not present

## 2019-03-11 DIAGNOSIS — M5412 Radiculopathy, cervical region: Secondary | ICD-10-CM | POA: Diagnosis not present

## 2019-03-25 DIAGNOSIS — M2560 Stiffness of unspecified joint, not elsewhere classified: Secondary | ICD-10-CM | POA: Diagnosis not present

## 2019-03-25 DIAGNOSIS — R293 Abnormal posture: Secondary | ICD-10-CM | POA: Diagnosis not present

## 2019-03-25 DIAGNOSIS — M5412 Radiculopathy, cervical region: Secondary | ICD-10-CM | POA: Diagnosis not present

## 2019-04-08 DIAGNOSIS — R293 Abnormal posture: Secondary | ICD-10-CM | POA: Diagnosis not present

## 2019-04-08 DIAGNOSIS — M5412 Radiculopathy, cervical region: Secondary | ICD-10-CM | POA: Diagnosis not present

## 2019-04-08 DIAGNOSIS — M2560 Stiffness of unspecified joint, not elsewhere classified: Secondary | ICD-10-CM | POA: Diagnosis not present

## 2019-04-29 DIAGNOSIS — M2560 Stiffness of unspecified joint, not elsewhere classified: Secondary | ICD-10-CM | POA: Diagnosis not present

## 2019-04-29 DIAGNOSIS — M5412 Radiculopathy, cervical region: Secondary | ICD-10-CM | POA: Diagnosis not present

## 2019-04-29 DIAGNOSIS — R293 Abnormal posture: Secondary | ICD-10-CM | POA: Diagnosis not present

## 2024-05-25 ENCOUNTER — Other Ambulatory Visit (INDEPENDENT_AMBULATORY_CARE_PROVIDER_SITE_OTHER): Payer: Self-pay

## 2024-05-25 ENCOUNTER — Encounter: Payer: Self-pay | Admitting: Physician Assistant

## 2024-05-25 ENCOUNTER — Ambulatory Visit: Payer: Self-pay | Admitting: Physician Assistant

## 2024-05-25 DIAGNOSIS — M25562 Pain in left knee: Secondary | ICD-10-CM

## 2024-05-25 DIAGNOSIS — M7651 Patellar tendinitis, right knee: Secondary | ICD-10-CM | POA: Insufficient documentation

## 2024-05-25 DIAGNOSIS — M2240 Chondromalacia patellae, unspecified knee: Secondary | ICD-10-CM | POA: Insufficient documentation

## 2024-05-25 DIAGNOSIS — G8929 Other chronic pain: Secondary | ICD-10-CM | POA: Insufficient documentation

## 2024-05-25 DIAGNOSIS — M25561 Pain in right knee: Secondary | ICD-10-CM

## 2024-05-25 DIAGNOSIS — M7652 Patellar tendinitis, left knee: Secondary | ICD-10-CM

## 2024-05-25 NOTE — Progress Notes (Signed)
 Office Visit Note   Patient: Sandra Ali           Date of Birth: 10/18/1996           MRN: [id number] Visit Date: 05/25/2024              Requested by: No referring provider defined for this encounter. PCP: No primary care provider on file.   Assessment & Plan: Visit Diagnoses:  1. Chronic pain of left knee   2. Chronic pain of right knee   3. Bilateral chronic knee pain   4. Chondromalacia, patella, unspecified laterality   5. Patellar tendinitis of both knees     Plan: Pleasant 28 year old woman who is accompanied by her mother.  She has had a long history of bilateral anterior knee pain.  She has had injuries to both knee which involved hitting a bed or another object in the front of her knee.  She is never really treated this except for anti-inflammatories and a lidocaine patch.  She occasionally gets aching in other joints of her body but the knees have been the most focus especially bad going downstairs.  Family history is unknown as she is adopted.  Exam today seem to be mostly tender over the patella tendons bilaterally also little patellofemoral pain.  I would like for her to engage with physical therapy and she is willing to do this.  Will order it for her today at [facility].  Can continue to use anti-inflammatory as needed could try topical agents such as CBD oils or Voltaren gel.  Will call back if she does not improve.  Follow-Up Instructions: Return if symptoms worsen or fail to improve.   Orders:  Orders Placed This Encounter  Procedures   XR KNEE 3 VIEW LEFT   Ambulatory referral to Physical Therapy   No orders of the defined types were placed in this encounter.     Procedures: No procedures performed   Clinical Data: No additional findings.   Subjective: Bilateral knee pain  HPI patient is a 28 year old woman who comes in today complaining of left knee pain.  She said she hit her left knee on a bed.  She said she actually had bilateral knee pain  for 10+ years.  She said she had an injury to the right knee 8 years ago.  Came worse in the last year.  She is taking diclofenac and ibuprofen.  Pain is bothersome with walking standing any weightbearing causes pain  Review of Systems  All other systems reviewed and are negative.    Objective: Vital Signs: There were no vitals taken for this visit.  Physical Exam Constitutional:      Appearance: Normal appearance.  Pulmonary:     Effort: Pulmonary effort is normal.   Skin:    General: Skin is warm and dry.   Neurological:     General: No focal deficit present.     Mental Status: She is alert and oriented to person, place, and time.   Psychiatric:        Mood and Affect: Mood normal.        Behavior: Behavior normal.    Ortho Exam Bilateral knees no effusion no erythema compartments are soft and compressible she is neurovascularly intact she does have a little grinding on range of motion she is tender to palpation over the patella tendons bilaterally has pain with resisted extension but strength is intact Specialty Comments:  No specialty comments available.  Imaging: XR KNEE  3 VIEW LEFT Result Date: 05/25/2024 Radiographs bilateral knees demonstrate no evidence of any fracture well-maintained alignment no degenerative changes    PMFS History: Patient Active Problem List   Diagnosis Date Noted   Bilateral chronic knee pain 05/25/2024   Chondromalacia, patella, unspecified laterality 05/25/2024   Patellar tendinitis of both knees 05/25/2024   Altered mental status 10/31/2016   Respiratory failure (HCC) 10/31/2016   Alcohol intoxication (HCC) 10/31/2016   Tobacco dependence 03/29/2016   Visit for preventive health examination 01/28/2015   Attention deficit hyperactivity disorder 01/28/2015   Adjustment disorder with anxious mood 11/23/2014   Well adolescent visit 09/07/2013   Medication management 03/13/2012   Idiopathic scoliosis and kyphoscoliosis 03/28/2009    [facility] MEDICINAL&BIOLOGICAL SBSTNC 04/30/2008   INSOMNIA UNSPECIFIED 02/24/2008   Attention deficit hyperactivity disorder (ADHD) 09/29/2007   Past Medical History:  Diagnosis Date   ADHD 09/29/2007   INSOMNIA UNSPECIFIED 02/24/2008   SCOLIOSIS, MILD 03/28/2009   SORE THROAT 11/24/2007   [facility] EFF OTH RX MEDICINAL\T\BIOLOGICAL SBSTNC 04/30/2008    Family History  Adopted: Yes    History reviewed. No pertinent surgical history. Social History   Occupational History   Not on file  Tobacco Use   Smoking status: Every Day    Current packs/day: 0.50    Types: Cigarettes   Smokeless tobacco: Never  Substance and Sexual Activity   Alcohol use: Yes    Alcohol/week: 0.0 standard drinks of alcohol   Drug use: No   Sexual activity: Not on file

## 2024-05-26 ENCOUNTER — Ambulatory Visit (INDEPENDENT_AMBULATORY_CARE_PROVIDER_SITE_OTHER): Payer: Self-pay | Admitting: Adult Health

## 2024-05-26 DIAGNOSIS — F4310 Post-traumatic stress disorder, unspecified: Secondary | ICD-10-CM | POA: Diagnosis not present

## 2024-05-26 DIAGNOSIS — F411 Generalized anxiety disorder: Secondary | ICD-10-CM | POA: Diagnosis not present

## 2024-05-26 DIAGNOSIS — G4700 Insomnia, unspecified: Secondary | ICD-10-CM | POA: Diagnosis not present

## 2024-05-26 DIAGNOSIS — F331 Major depressive disorder, recurrent, moderate: Secondary | ICD-10-CM | POA: Diagnosis not present

## 2024-05-26 MED ORDER — GABAPENTIN 400 MG PO CAPS: 400.0000 mg | ORAL_CAPSULE | Freq: Two times a day (BID) | ORAL | 1 refills | Status: DC

## 2024-05-26 NOTE — Progress Notes (Unsigned)
 [facility] MD/PA/NP Initial Note  05/28/2024 2:47 PM Sandra Ali  MRN:  [id number]  Chief Complaint:   HPI:   Accompanied by mother. Mother presenting with a list of medications that patient was/is taking and a list of medications that have been discontinued. She feels that she was taking too many medications and is concerned about her safety. She would like to continue tapering down on the medications.  Therapist - Barnett Libel  Describes mood today as so-so. Pleasant. Appropriate. Mood symptoms - denies depression - not per say - maybe a little. Reports an overall decrease in interest and motivation. Reports feeling anxious most of the time, but feels it is more situational. Reports irritability at times. Reports panic attacks. Reports worry, rumination and over thinking. Reports mood is variable - it goes up and down - depends on my mood. Stating I feel like I'm doing ok. Taking medications as prescribed.  Energy levels variable - depends on my mood Active, does not have a regular exercise routine - reports walking the dog. Enjoys some usual interests and activities - gaming - making music - friends. Lives with mother and sister - age 57 - dog and cat.  Appetite adequate. Weight stable - 172 pounds 64. Sleeps better some nights than others. Averages 8 hours. Reports focus and concentration difficulties. Diagnosed with ADD in elementary school. Has tried various stimulants over the years.Completing tasks. Managing aspects of household. Currently unemployed. Denies SI or HI.  Denies AH or VH. Denies self harm - picking at fingers. Denies substance use. Reports CBD use.  Previous medication trials: Trazadone, Ritalin , Daytranna, Stratera,   Visit Diagnosis:    ICD-10-CM   1. Generalized anxiety disorder  F41.1 gabapentin (NEURONTIN) 400 MG capsule    2. Major depressive disorder, recurrent episode, moderate (HCC)  F33.1     3. PTSD (post-traumatic stress disorder)   F43.10     4. Insomnia, unspecified type  G47.00       Past Psychiatric History: Reports a stay at [facility] at age 9. Admitted to [facility] for ETOH poisoning.   Past Medical History:  Past Medical History:  Diagnosis Date   ADHD 09/29/2007   INSOMNIA UNSPECIFIED 02/24/2008   SCOLIOSIS, MILD 03/28/2009   SORE THROAT 11/24/2007   [facility] EFF OTH RX MEDICINAL\T\BIOLOGICAL SBSTNC 04/30/2008   No past surgical history on file.  Family Psychiatric History: Unknown of any family history of mental illness.   Family History:  Family History  Adopted: Yes    Social History:  Social History   Socioeconomic History   Marital status: Single    Spouse name: Not on file   Number of children: Not on file   Years of education: Not on file   Highest education level: Not on file  Occupational History   Not on file  Tobacco Use   Smoking status: Every Day    Current packs/day: 0.50    Types: Cigarettes   Smokeless tobacco: Never  Substance and Sexual Activity   Alcohol use: Yes    Alcohol/week: 0.0 standard drinks of alcohol   Drug use: No   Sexual activity: Not on file  Other Topics Concern   Not on file  Social History Narrative   HH of 3   No tobacco   Adopted from  [place]   [employer] in 8th grade   Now [employer] program    a job at  [employer].   Fellowship hall February  March  Livingin  [place] July 18 hh of 3 no etoh   Social Drivers of Corporate investment banker Strain: Not on file  Food Insecurity: Not on file  Transportation Needs: Not on file  Physical Activity: Not on file  Stress: Not on file  Social Connections: Not on file    Allergies: No Known Allergies  Metabolic Disorder Labs: No results found for: HGBA1C, MPG No results found for: PROLACTIN Lab Results  Component Value Date   CHOL 110 02/14/2015   TRIG 104.0 02/14/2015   HDL 46.50 02/14/2015   CHOLHDL 2 02/14/2015   VLDL 20.8 02/14/2015   LDLCALC 43 02/14/2015   Lab  Results  Component Value Date   TSH 0.91 02/14/2015    Therapeutic Level Labs: No results found for: LITHIUM No results found for: VALPROATE No results found for: CBMZ  Current Medications: Current Outpatient Medications  Medication Sig Dispense Refill   gabapentin (NEURONTIN) 400 MG capsule Take 1 capsule (400 mg total) by mouth 2 (two) times daily. 60 capsule 1   atomoxetine  (STRATTERA ) 40 MG capsule Take 1 capsule (40 mg total) by mouth daily. 90 capsule 0   Doxylamine Succinate, Sleep, (SLEEP AID PO) Take 1 tablet by mouth at bedtime.     escitalopram  (LEXAPRO ) 20 MG tablet Take 1 tablet (20 mg total) by mouth daily. **DUE FOR OFFICE VISIT FOR FURTHER REFILLS! 30 tablet 0   naltrexone  (DEPADE) 50 MG tablet Take 1 tablet (50 mg total) by mouth daily. 90 tablet 1   No current facility-administered medications for this visit.    Medication Side Effects: none  Orders placed this visit:  No orders of the defined types were placed in this encounter.   Psychiatric Specialty Exam:  Review of Systems  Musculoskeletal:  Negative for gait problem.  Neurological:  Negative for tremors.  Psychiatric/Behavioral:         Please refer to HPI    There were no vitals taken for this visit.There is no height or weight on file to calculate BMI.  General Appearance: Casual and Neat  Eye Contact:  Good  Speech:  Clear and Coherent and Normal Rate  Volume:  Normal  Mood:  Euthymic  Affect:  Appropriate and Congruent  Thought Process:  Coherent and Descriptions of Associations: Intact  Orientation:  Full (Time, Place, and Person)  Thought Content: Logical   Suicidal Thoughts:  No  Homicidal Thoughts:  No  Memory:  WNL  Judgement:  Good  Insight:  Good  Psychomotor Activity:  Normal  Concentration:  Concentration: Good and Attention Span: Good  Recall:  Good  Fund of Knowledge: Good  Language: Good  Assets:  Communication Skills Desire for Improvement Financial  Resources/Insurance Housing Intimacy Leisure Time Physical Health Resilience Social Support Talents/Skills Transportation Vocational/Educational  ADL's:  Intact  Cognition: WNL  Prognosis:  Good   Screenings:  [facility] Office Visit from 09/07/2013 in [facility]  PHQ-2 Total Score 0    Receiving Psychotherapy: Yes   Treatment Plan/Recommendations:   Plan:  PDMP reviewed  Patient currently taking: Buspar 15mg  TID  Wellbturin SR 200mg  BID  Lexapro  20mg  daily  Gabapentin 400mg  BID  Hydroxyzine 50mg  BID from TID - 1 refill remaining - discussed tapering off the medication  Consider Genesight testing  RTC 4 weeks  Patient advised to contact office with any questions, adverse effects, or acute worsening in signs and symptoms.  60 minutes spent dedicated to the  care of this patient on the date of this encounter to include pre and post-visit review of records, ordering of medication, post visit documentation, and face-to-face time with the patient discussing MDD, GAD, PTSD and insomnia. Discussed medication changes and plan for tapering off medications. Will start tapering Hydroxyzine to start.  Quantina Dershem N Anjeli Casad, NP

## 2024-05-28 ENCOUNTER — Encounter: Payer: Self-pay | Admitting: Adult Health

## 2024-06-23 ENCOUNTER — Ambulatory Visit: Admitting: Adult Health

## 2024-06-24 ENCOUNTER — Ambulatory Visit: Admitting: Professional Counselor

## 2024-07-10 ENCOUNTER — Other Ambulatory Visit: Payer: Self-pay | Admitting: Adult Health

## 2024-07-10 DIAGNOSIS — F411 Generalized anxiety disorder: Secondary | ICD-10-CM

## 2024-07-16 ENCOUNTER — Ambulatory Visit (HOSPITAL_BASED_OUTPATIENT_CLINIC_OR_DEPARTMENT_OTHER): Admitting: Physical Therapy

## 2024-07-28 ENCOUNTER — Ambulatory Visit: Admitting: Adult Health

## 2024-07-28 ENCOUNTER — Other Ambulatory Visit: Payer: Self-pay | Admitting: Family Medicine

## 2024-07-28 DIAGNOSIS — R220 Localized swelling, mass and lump, head: Secondary | ICD-10-CM

## 2024-08-13 ENCOUNTER — Ambulatory Visit
Admission: RE | Admit: 2024-08-13 | Discharge: 2024-08-13 | Disposition: A | Discharge status: Home / Self Care | Source: Ambulatory Visit | Attending: Family Medicine | Admitting: Family Medicine

## 2024-08-13 DIAGNOSIS — R220 Localized swelling, mass and lump, head: Secondary | ICD-10-CM
